# Patient Record
Sex: Female | Born: 1970 | Race: Black or African American | Hispanic: No | Marital: Single | Smoking: Never smoker
Health system: Southern US, Community
[De-identification: ages and names within clinical notes are randomized; demographics above are authoritative.]

## PROBLEM LIST (undated history)

## (undated) DIAGNOSIS — D259 Leiomyoma of uterus, unspecified: Secondary | ICD-10-CM

## (undated) DIAGNOSIS — N200 Calculus of kidney: Secondary | ICD-10-CM

## (undated) DIAGNOSIS — N803 Endometriosis of pelvic peritoneum: Secondary | ICD-10-CM

## (undated) DIAGNOSIS — Z973 Presence of spectacles and contact lenses: Secondary | ICD-10-CM

## (undated) DIAGNOSIS — D649 Anemia, unspecified: Secondary | ICD-10-CM

## (undated) DIAGNOSIS — N261 Atrophy of kidney (terminal): Secondary | ICD-10-CM

## (undated) DIAGNOSIS — D472 Monoclonal gammopathy: Principal | ICD-10-CM

## (undated) DIAGNOSIS — N939 Abnormal uterine and vaginal bleeding, unspecified: Secondary | ICD-10-CM

## (undated) DIAGNOSIS — N1330 Unspecified hydronephrosis: Secondary | ICD-10-CM

## (undated) HISTORY — DX: Endometriosis of pelvic peritoneum: N80.3

## (undated) HISTORY — DX: Monoclonal gammopathy: D47.2

## (undated) HISTORY — PX: OTHER SURGICAL HISTORY: SHX169

## (undated) HISTORY — DX: Leiomyoma of uterus, unspecified: D25.9

---

## 2000-05-13 ENCOUNTER — Emergency Department (HOSPITAL_COMMUNITY): Admission: EM | Admit: 2000-05-13 | Discharge: 2000-05-13 | Payer: Self-pay | Admitting: Emergency Medicine

## 2000-05-14 ENCOUNTER — Encounter: Payer: Self-pay | Admitting: Emergency Medicine

## 2000-12-05 ENCOUNTER — Other Ambulatory Visit: Admission: RE | Admit: 2000-12-05 | Discharge: 2000-12-05 | Payer: Self-pay | Admitting: Obstetrics & Gynecology

## 2001-03-27 ENCOUNTER — Ambulatory Visit (HOSPITAL_COMMUNITY): Admission: RE | Admit: 2001-03-27 | Discharge: 2001-03-27 | Payer: Self-pay | Admitting: Obstetrics & Gynecology

## 2001-03-27 ENCOUNTER — Encounter (INDEPENDENT_AMBULATORY_CARE_PROVIDER_SITE_OTHER): Payer: Self-pay | Admitting: *Deleted

## 2001-03-27 HISTORY — PX: OTHER SURGICAL HISTORY: SHX169

## 2001-09-22 ENCOUNTER — Other Ambulatory Visit: Admission: RE | Admit: 2001-09-22 | Discharge: 2001-09-22 | Payer: Self-pay | Admitting: Obstetrics & Gynecology

## 2002-04-05 ENCOUNTER — Inpatient Hospital Stay (HOSPITAL_COMMUNITY): Admission: AD | Admit: 2002-04-05 | Discharge: 2002-04-08 | Payer: Self-pay | Admitting: Obstetrics & Gynecology

## 2003-05-08 ENCOUNTER — Emergency Department (HOSPITAL_COMMUNITY): Admission: AD | Admit: 2003-05-08 | Discharge: 2003-05-08 | Payer: Self-pay | Admitting: Family Medicine

## 2003-05-10 ENCOUNTER — Emergency Department (HOSPITAL_COMMUNITY): Admission: AD | Admit: 2003-05-10 | Discharge: 2003-05-10 | Payer: Self-pay | Admitting: Family Medicine

## 2003-05-31 ENCOUNTER — Emergency Department (HOSPITAL_COMMUNITY): Admission: EM | Admit: 2003-05-31 | Discharge: 2003-05-31 | Payer: Self-pay | Admitting: Family Medicine

## 2003-07-04 ENCOUNTER — Emergency Department (HOSPITAL_COMMUNITY): Admission: EM | Admit: 2003-07-04 | Discharge: 2003-07-04 | Payer: Self-pay | Admitting: *Deleted

## 2005-05-21 ENCOUNTER — Other Ambulatory Visit: Admission: RE | Admit: 2005-05-21 | Discharge: 2005-05-21 | Payer: Self-pay | Admitting: Obstetrics and Gynecology

## 2006-09-28 ENCOUNTER — Emergency Department (HOSPITAL_COMMUNITY): Admission: EM | Admit: 2006-09-28 | Discharge: 2006-09-28 | Payer: Self-pay | Admitting: Emergency Medicine

## 2006-10-11 ENCOUNTER — Ambulatory Visit (HOSPITAL_BASED_OUTPATIENT_CLINIC_OR_DEPARTMENT_OTHER): Admission: RE | Admit: 2006-10-11 | Discharge: 2006-10-11 | Payer: Self-pay | Admitting: Urology

## 2006-10-18 ENCOUNTER — Ambulatory Visit (HOSPITAL_COMMUNITY): Admission: RE | Admit: 2006-10-18 | Discharge: 2006-10-18 | Payer: Self-pay | Admitting: Urology

## 2006-10-22 ENCOUNTER — Ambulatory Visit: Admission: RE | Admit: 2006-10-22 | Discharge: 2006-10-22 | Payer: Self-pay | Admitting: Gynecologic Oncology

## 2007-02-03 ENCOUNTER — Ambulatory Visit (HOSPITAL_COMMUNITY): Admission: RE | Admit: 2007-02-03 | Discharge: 2007-02-03 | Payer: Self-pay | Admitting: Urology

## 2007-04-21 ENCOUNTER — Encounter: Admission: RE | Admit: 2007-04-21 | Discharge: 2007-04-21 | Payer: Self-pay | Admitting: Obstetrics & Gynecology

## 2007-04-25 ENCOUNTER — Ambulatory Visit (HOSPITAL_BASED_OUTPATIENT_CLINIC_OR_DEPARTMENT_OTHER): Admission: RE | Admit: 2007-04-25 | Discharge: 2007-04-25 | Payer: Self-pay | Admitting: Urology

## 2007-10-24 ENCOUNTER — Ambulatory Visit (HOSPITAL_BASED_OUTPATIENT_CLINIC_OR_DEPARTMENT_OTHER): Admission: RE | Admit: 2007-10-24 | Discharge: 2007-10-24 | Payer: Self-pay | Admitting: Urology

## 2008-05-04 ENCOUNTER — Ambulatory Visit (HOSPITAL_COMMUNITY): Admission: RE | Admit: 2008-05-04 | Discharge: 2008-05-04 | Payer: Self-pay | Admitting: Urology

## 2008-06-21 ENCOUNTER — Emergency Department (HOSPITAL_COMMUNITY): Admission: EM | Admit: 2008-06-21 | Discharge: 2008-06-21 | Payer: Self-pay | Admitting: Family Medicine

## 2008-07-07 ENCOUNTER — Encounter: Admission: RE | Admit: 2008-07-07 | Discharge: 2008-07-07 | Payer: Self-pay | Admitting: Family Medicine

## 2009-01-31 ENCOUNTER — Encounter: Admission: RE | Admit: 2009-01-31 | Discharge: 2009-01-31 | Payer: Self-pay | Admitting: Family Medicine

## 2010-05-31 ENCOUNTER — Other Ambulatory Visit: Payer: Self-pay | Admitting: Family Medicine

## 2010-05-31 DIAGNOSIS — Z1231 Encounter for screening mammogram for malignant neoplasm of breast: Secondary | ICD-10-CM

## 2010-06-13 NOTE — Op Note (Signed)
NAMESAMONE, GUHL              ACCOUNT NO.:  [id number]   MEDICAL RECORD NO.:  [id number]          PATIENT TYPE:  AMB   LOCATION:  [facility]                         FACILITY:  [facility]   PHYSICIAN:  Lindaann Slough, M.D.  DATE OF BIRTH:  01-16-1971   DATE OF PROCEDURE:  10/24/2007  DATE OF DISCHARGE:                               OPERATIVE REPORT   PREOPERATIVE DIAGNOSES:  1. History of endometriosis.  2. Left ureteral obstruction.   POSTOPERATIVE DIAGNOSES:  1. History of endometriosis.  2. Left ureteral obstruction.   PROCEDURES:  1. Cystourethroscopy.  2. Removal of left ureteral stent.  3. Left retrograde pyelogram.  4. Intraoperative fluoroscopy.  5. Replacement of left 8 x 26 Polaris ureteral stent.   ATTENDING PHYSICIAN:  Su Grand, MD.   RESIDENT PHYSICIAN:  Dr. Delman Kitten.   ANESTHESIA:  General.   INDICATIONS FOR PROCEDURE:  Taylor Parks is a 40 year old African American  female with history of endometriosis.  She has been followed by Dr. Brunilda Payor  for quite some time for a left distal ureteral obstruction secondary to  her endometriosis.  She has been on chronic stent changes for this.   PROCEDURE IN DETAIL:  The patient was brought into the operating room,  placed in the supine position.  She was correctly identified by her  wristband.  Appropriate time-out was taken.  IV antibiotics were  administered.  General anesthesia was delivered.  Once adequately  anesthetized, she was placed in the dorsal lithotomy position.  Great  care taken to minimize the risk of peripheral neuropathy or compartment  syndrome.  Her perineum was prepped and draped sterilely.  We began by  placing a 22-French rigid cystoscopic sheath with a 12 degree lens and  normal saline as our irrigant into the bladder per urethra.  Urethra was  normal in course and caliber.  Upon entering the bladder clear urine was  identified, there were no urothelial abnormalities.  The distal end of  the Polaris stent  was visualized exiting from the left ureteral orifice,  it was grasped using flexible graspers and externalized out her urethral  meatus.  A sensor-tipped guidewire was advanced through the stent  directed to the left renal pelvis with the aid of fluoroscopy.  The  stent was then removed leaving the guidewire in place.  Cystoscope was  then replaced back into the bladder, with an acorn-tipped catheter we  performed left retrograde pyelogram.   The left retrograde pyelogram demonstrated complete  cessation/obstruction of flow in the intramural ureter/UVJ region.  There was no antegrade flow, there was no retrograde flow beyond this  despite having the guidewire in place.  The sensor-tipped catheter could  not be advanced past this obstruction either.  We subsequently removed  the acorn-tipped catheter and the cystoscope.  We then advanced a 6-  [place] open-ended catheter over the guidewire into the distal left  ureter just proximal to the area of obstruction.  Here a left retrograde  pyelogram demonstrated a normal ureter above the obstructed area and no  antegrade flow beyond it.  This did appear to be a short segment  of  obstructed ureter based on our imaging above and below it.  We then re-  advanced the sensor-tipped guidewire through the open-ended catheter,  again directed it into the left renal pelvis.  We backloaded the  cystoscope over the guidewire and successfully replaced, a left 8 x 28  Polaris stent with the proximal curl positioned where in the lower pole  calix, not completely coiled but coiled enough to maintain its position  and the distal bands were noted to be in the bladder.  The stent did end  at the level of the ureteral orifice.  It was effluxing clear urine and  contrast.  We emptied her bladder, removed the cystoscope and placed  some Urojet per urethra and this marked the end of our procedure.  She  tolerated the procedure well, there were no complications, she  awoke  from anesthesia, was taken to the recovery room in stable condition.  Dr. Brunilda Payor was present and participated in all aspects of the case.  There  were no complications.     ______________________________  Maia Plan, M.D.  Electronically Signed    DW/MEDQ  D:  10/24/2007  T:  10/25/2007  Job:  [id number]

## 2010-06-13 NOTE — Consult Note (Signed)
NAMETEONNA, COONAN              ACCOUNT NO.:  [id number]   MEDICAL RECORD NO.:  [id number]          PATIENT TYPE:  OUT   LOCATION:  GYN                          FACILITY:  [facility]   PHYSICIAN:  Paola A. Duard Brady, MD    DATE OF BIRTH:  09-15-1970   DATE OF CONSULTATION:  10/22/2006  DATE OF DISCHARGE:                                 CONSULTATION   HISTORY OF PRESENT ILLNESS:  The patient is a 40 year old gravida 4,  para 2 who appears to have had a history of pelvic pain for quite some  time for about the past 5-6 years.  She did undergo diagnostic  laparoscopy, biopsies of what was felt to be endometriosis, fulguration  of pelvic endometriosis, lysis of adhesions, hysteroscopy, D&C and  resection of a small endometrial polyp by Dr. Jennette Kettle in February 2007.  The patient states at that time that she did not know that she had  endometriosis.  We do have records of a Pap smear from that time that  was unremarkable.  She had acute worsening of her pain on August 30 and  presented to the emergency room at that time.  She underwent a CT scan  that showed left-sided hydronephrosis and findings consistent with  possible cervical malignancy.  Specifically, she was noted to have a  large pelvic mass that appeared to be infiltrating the left parametria  and sidewall.  The patient underwent a ureteral stent by Dr. Brunilda Payor.  Since that time, her abdominal and back pain has decreased somewhat.  She continues to have significant pelvic pain, not only with her menses  but throughout the month.  She had been seen by Dr. Normand Sloop and in May  2008 had a Jearld Adjutant IUD placed, felt to help with her menstrual period.  That did not help sufficiently.  Therefore, in July 2008, the IUD was  removed.  Her last cycle was October 14, 2006.  She states that the  only thing that really helps her pain is to take Percocet.  She really  has no benefit from ibuprofen.  In the past, she has taken birth control  pills since her  surgery with Dr. Jennette Kettle and she also states that birth  control pills did not help her endometriosis or pelvic pain  significantly.   PAST MEDICAL HISTORY:  As above.   PAST SURGICAL HISTORY:  1. D&C.  2. C section.  3. Fulguration.  4. Endometriosis.   SOCIAL HISTORY:  She is single.  She denies use of tobacco or alcohol.   FAMILY HISTORY:  Significant for heart disease in her mother.  Grandmother died of heart attack.  There is no history of any  gynecologic malignancies.  Past GYN history in 2002, significant pain,  nausea, vomiting with menses.  Was managed by birth control pills for  history of endometriosis.   REVIEW OF SYSTEMS:  Denies any chest pain, shortness of breath, nausea,  vomiting with the exception of with her menses.  She denies any fevers,  night sweats, any unintentional weight loss or weight gain.  She does  have general abdominal pain  which is worse with her menses.  She denies  any early satiety or bloating.  She has had a couple episodes of nausea  and vomiting with her menses.  She denies any dyspareunia.  She was seen  by Dr. Antionette Char in the earlier part of September.  Of note,  the patient had a negative Pap smear, negative GC and chlamydia in April  2007.  Pap smear was negative in September.  Endometrial biopsy was  performed in September which by report was also negative.   PHYSICAL EXAMINATION:  VITAL SIGNS:  Weight 142 pounds, height 5 feet  3/4 inches.  GENERAL:  Well-nourished, well-developed female in no acute distress.  NECK:  Supple with no lymphadenopathy, no thyromegaly.  LUNGS:  Clear to auscultation bilaterally.  CARDIOVASCULAR:  Regular rate and rhythm.  ABDOMEN:  Soft, nontender, nondistended.  She has a well-healed  transverse Pfannenstiel incision.  She has a well-healed infraumbilical  skin incision.  Abdomen is soft, nontender, nondistended.  No palpable  masses or past thyromegaly.  Groins are negative for adenopathy.   EXTREMITIES:  No edema.  PELVIC:  External genitalia is within normal limits.  Vagina is well  epithelialized.  The cervix is multiparous.  It is large and bulbus.  There are no visible lesions.  There is no menstrual flow.  There is no  discharge.  Bimanual examination reveals an abdominal pelvic mass  measuring approximately 8 cm.  It tends to be more towards the patient's  left side.  It is somewhat tender with palpation.  On rectovaginal  examination, the parametria on the left side appears to be somewhat  indurated.  The side wall is free.  The rectal mucosa is smooth.  The  parametria on the patient's right side is free as is the side wall.   ASSESSMENT:  A 40 year old, who in evaluating her history imaging  studies and physical examination has what is most consistent with severe  pelvic endometriosis.  She may very well have a large endometrioma  versus fibrotic endometriosis that has caused her retroperitoneal  fibrosis and kinking of her ureter.  I do not feel that she has an  obvious cervical carcinoma.  Her Pap smears have been normal.  She has  also had a negative endometrial biopsy.  In addition, there is no  appearance of anything that could be suggestive of ovarian cancer at  this point.  We had a lengthy discussion with Dr. Tamela Oddi present.  We will attempt Lupron.  The patient knows that the first cycle of  Lupron may not help her significantly, but however, we will proceed with  two cycles.  If her pain increases significantly, we may need to proceed  with exploratory laparotomy for both diagnosis and therapeutic purposes  immediately.  However if her for pain decreases significantly with  Lupron, we will follow her exam, and at some point if her exam becomes  improved, we can consider intervention with surgery.  If we proceed with  the Lupron and her symptoms improve but the exam findings do not  improve, will need to have of lengthy discussion regarding the  benefits  of surgery at that point.  A lengthy discussion was had.  This  discussion will continue with Dr. Tamela Oddi.      Paola A. Duard Brady, MD  Electronically Signed     PAG/MEDQ  D:  10/22/2006  T:  10/23/2006  Job:  [id number]   cc:   Roseanna Rainbow, M.D.  Fax: [phone]   W. Varney Baas, M.D.  Fax: [phone]   Telford Nab, R.N.  [address]  [place], [place] [address]

## 2010-06-13 NOTE — Op Note (Signed)
[name], Taylor Parks              ACCOUNT NO.:  [id number]   MEDICAL RECORD NO.:  [id number]          PATIENT TYPE:  AMB   LOCATION:  [facility]                         FACILITY:  [facility]   PHYSICIAN:  Lindaann Slough, M.D.  DATE OF BIRTH:  [birth year]   DATE OF PROCEDURE:  04/25/2007  DATE OF DISCHARGE:                               OPERATIVE REPORT   PREOPERATIVE DIAGNOSIS:  Left hydronephrosis secondary to endometriosis.   POSTOPERATIVE DIAGNOSIS:  Left hydronephrosis secondary to  endometriosis.   PROCEDURE:  Cystoscopy, left retrograde pyelogram and left double-J  stent exchange.   SURGEON:  Dr. Brunilda Payor.   ANESTHESIA:  General.   INDICATIONS:  The patient is a 40 year old female who has a left double-  J catheter in place for left hydronephrosis secondary to endometriosis.  She has been treated by Dr. Tamela Oddi.  Renal scan showed 21%  function of the left kidney, 79% function of the right.  She is  scheduled today for cystoscopy, left retrograde pyelogram and  replacement of double-J catheter.  The double-J catheter was first  inserted on October 11, 2006.   Under general anesthesia, the patient was prepped and draped and placed  in the dorsal lithotomy position. A panendoscope was inserted in the  bladder.  The bladder mucosa is normal.  There is no stone or tumor in  the bladder.  The distal curl of the double-J catheter is in the  bladder.  It was then grasped with grasping forceps and pulled out of  the urethra.  A Glidewire was then passed through the double-J catheter  and advanced all the way up into the renal pelvis and the double-J  catheter was removed.   RETROGRADE PYELOGRAM:   A cone-tip catheter was passed through the cystoscope into the left  ureteral orifice.  Contrast was then injected through the cone-tip  catheter.  The dye could not pass up into the mid ureter.  There is a  stricture at the distal ureter.  The cone-tip catheter was then removed.  An  open-ended catheter was passed over the Glidewire up to the mid  ureter.  Contrast was then injected through the open-ended catheter. The  proximal and mid ureter are moderately dilated. The calyces are  moderately dilated.  In pulling the ureteral catheter back into the  distal ureter, there was a point where the contrast would not go into  the distal ureter which is the point of obstruction.  The Glidewire was  then passed through the open-ended catheter and the open-ended catheter  was removed.   A #8-French - 26 Polaris stent was then passed over the guidewire. The  proximal curl of the Polaris stent is in the bladder and the loops of  the stent are in the bladder.  The bladder was then emptied and the  cystoscope and guidewire removed.   The patient tolerated the procedure well and left the OR in satisfactory  condition to post anesthesia care unit.      Lindaann Slough, M.D.  Electronically Signed     MN/MEDQ  D:  04/25/2007  T:  04/26/2007  Job:  [id number]   cc:   Roseanna Rainbow, M.D.  Fax: [phone]

## 2010-06-13 NOTE — Op Note (Signed)
Taylor Parks, Taylor Parks              ACCOUNT NO.:  [id number]   MEDICAL RECORD NO.:  [id number]          PATIENT TYPE:  AMB   LOCATION:  [facility]                         FACILITY:  [facility]   PHYSICIAN:  Lindaann Slough, M.D.  DATE OF BIRTH:  [birth year]   DATE OF PROCEDURE:  10/11/2006  DATE OF DISCHARGE:                               OPERATIVE REPORT   PREOPERATIVE DIAGNOSIS:  Left hydronephrosis.   POSTOPERATIVE DIAGNOSIS:  Left hydronephrosis.   PROCEDURE:  Cystoscopy, left retrograde pyelogram, ureteroscopy, and  insertion of double-J catheter.   SURGEON:  Danae Chen, M.D.   ANESTHESIA:  General.   INDICATION:  The patient is a 40 year old female who was seen in the  emergency room on September 28, 2006, with a history of sudden onset of  severe left flank pain associated with nausea and vomiting.  CT scan  showed marked left hydronephrosis down to the level of the distal  ureter.  The lower uterine segment was also enlarged.  The patient is  scheduled today for cystoscopy, retrograde pyelogram, and insertion of  double-J catheter.   DESCRIPTION OF PROCEDURE:  Under general anesthesia, the patient was  prepped and draped in the dorsal lithotomy position.  A panendoscope was  inserted in the bladder.  The bladder mucosa is normal.  There is no  stone or tumor in the bladder.  The ureteral orifices are in normal  position and shape with normal efflux from the right ureteral orifice  and decreased efflux from the left.   RETROGRADE PYELOGRAM:   A cone tip catheter was passed through the cystoscope into the left  ureteral orifice.  Contrast was then injected through the cone tip  catheter.  The distal ureter is normal.  There is an area of decreased  caliber of the ureter in the distal ureter below the SI joint with  marked dilation of the ureter above that area. The proximal ureter and  collecting system are also dilated with a kink at the upper ureter.  The  cone tip catheter was  then removed.   A sensor tip guidewire was then passed through the cystoscope into the  ureter and advanced all the way up into the renal pelvis.  A 5 French  open ended catheter was then passed over the guidewire and the guidewire  was removed.  Contrast was then injected through the open ended catheter  and the calices are markedly dilated.  The guidewire was then passed  through the open ended catheter and the open ended catheter was removed.  The cystoscope was removed.  A semi-rigid ureteroscope was then passed  in the bladder into the ureter. There is no evidence of filling defect  in the ureter and the strictured area was passed without great  difficulty into the upper ureter.  The upper ureter appears markedly  dilated without any evidence of filling defect.  The ureteroscope was  then removed.   The guidewire was then back loaded into the cystoscope and a 6 French 28  Contour double J catheter was passed over the guidewire.  The proximal  curl of the  double J catheter is in the renal pelvis, the distal curl is  in the bladder.  The bladder was then emptied and the cystoscope and  guidewire were removed.  Then, bimanual examination was  done. The uterus appeared markedly enlarged and is also firm and nodular  and that is more pronounced on the left side.  The patient tolerated the  procedure well and left the OR in satisfactory condition to the post  anesthesia care unit.      Lindaann Slough, M.D.  Electronically Signed     MN/MEDQ  D:  10/11/2006  T:  10/12/2006  Job:  [id number]   cc:   Naima A. Normand Sloop, M.D.  Fax: [phone]

## 2010-06-16 NOTE — Op Note (Signed)
[facility]  Patient:    Taylor Parks, Taylor Parks Visit Number: [id number] MRN: [id number]          Service Type: DSU Location: [facility] Attending Physician:  Minette Headland Dictated by:   Freddy Finner, M.D. Proc. Date: 03/27/01 Admit Date:  03/27/2001                             Operative Report  PREOPERATIVE DIAGNOSES:       Chronic left pelvic pain, nodular irregularity of endometrial cavity on pelvic ultrasound.  POSTOPERATIVE DIAGNOSES:      Uterine enlargement, extensive pelvic adhesions, omental adhesions to anterior abdominal wall, pelvic endometriosis, small endometrial polyp.  OPERATIVE PROCEDURE:          Laparoscopy, fulguration of pelvic endometriosis, lysis of pelvic adhesions, hysteroscopy with dilatation and curettage and resection of small endometrial polyp.  SURGEON:                      Freddy Finner, M.D.  ANESTHESIA:                   General.  ESTIMATED BLOOD LOSS:         Less than 20 cc.  FLUIDS:                       Sorbitol deficit of 120 cc.  COMPLICATIONS:                None.  INTRAOPERATIVE FINDINGS:      Essentially as noted in the postoperative diagnoses.  Those findings were recorded in still photographs which are retained in the office record.  PROCEDURE:                    Patient was admitted on the morning of surgery, brought to the operating room, placed under adequate general endotracheal anesthesia.  Placed in the dorsal lithotomy position using the [facility] stirrup system.  Betadine prep of abdomen, perineum, and vagina was accomplished in the usual fashion.  Bladder was evacuated with a Robinson catheter.  Cervix was visualized, grasped with a single tooth tenaculum, and a Hulka tenaculum inserted through the cervix without difficulty.  Sterile drapes were then applied.  Two small incisions were made, one at the umbilicus and one just above the symphysis.  Through the upper incision a 10 mm atraumatic  trocar was introduced without difficulty.  Direct inspection revealed adequate placement without evidence of injury on entry.  Pneumoperitoneum was allowed to accumulate.  A second 5 mm trocar was placed through the lower incision under direct visualization.  Through this trocar sleeve a blunt probe was used during the procedure as well as a [place] irrigating system.  Systematic examination of pelvic and abdominal contents was carried out.  The appendix was visualized and was normal.  The upper abdomen appeared to be normal including no perihepatic adhesions or lesions.  There was an omental adhesion to the anterior abdominal wall.  This is consistent with previous surgical delivery by cesarean.  There were extensive anterior adhesions or bandlike anterior adhesions between the bladder flap and the uterus.  There were scattered lesions along the bladder flap and to the left of midline which clearly represented active endometriosis with powder burn like lesions and evidence of old scarring.  There were adhesions of the rectosigmoid to the left pelvic side wall at and just  beneath the left uterosacral ligament.  The tubes and ovaries were actually free on both sides and appeared to be normal. The uterus was enlarged overall in size.  Using the reticulating endo shears through the operating channel of the laparoscope with unipolar cautery attached, the adhesions were lysed to the anterior abdominal wall.  One bleeding source on this piece of omentum was coagulated with bipolar coagulation for complete hemostasis.  The endometriotic lesions anterior to the uterus were fulgurated using the endo shears with unipolar cautery.  There was a small approximately 4 mm endometrioma on the left ovary which was fulgurated with the endo shears and drained.  The bowel was so tightly adherent to the left uterosacral region that it was elected not to proceed with aggressive dissection for fear of bowel  injury.  After completing the abdominal portion of the procedure copious irrigation was carried out.  The irrigating solution was aspirated from the abdomen with complete hemostasis. The gas was allowed to escape.  The instruments were removed.  The skin incisions were closed with interrupted subcuticular sutures of 3-0 Dexon and Steri-Strips were applied to the lower incision.  Approximately 6 cc of 0.25% Marcaine was used to inject the incision sites for postoperative analgesia. Attention was then turned to the hysteroscopic portion of the procedure.  The Hulka tenaculum was removed.  The single tooth tenaculum was used to grasp the anterior cervical lip.  Uterus sounded to 11 cm.  Using the [facility] 12.5 degree hysteroscope with 3% Sorbitol as the distending medium, careful systematic examination of the endometrial cavity was carried out.  There were some tiny polypoid structures noted along the uterine wall.  Both tubal ostia were identified.  One somewhat large, but still a small polyp was identified. Careful thorough sharp curettage and exploration with polyp forceps removed the polyps.  This was confirmed by reinspection of the uterine cavity.  At this point the instruments were removed.  The procedure was terminated.  The patient was awakened and taken to recovery in good condition.  She will be discharged to routine outpatient surgical instructions with a regular diet and with Vicodin for pain.  She is to return to the office in approximately two weeks for postoperative follow-up.  She is given routine outpatient surgical instructions. Dictated by:   Freddy Finner, M.D. Attending Physician:  Minette Headland DD:  03/27/01 TD:  03/27/01 Job: [id number]

## 2010-06-16 NOTE — H&P (Signed)
   NAMEGALYA, DUNNIGAN                          ACCOUNT NO.:  [id number]   MEDICAL RECORD NO.:  [id number]                   PATIENT TYPE:  MAT   LOCATION:  [facility]                                 FACILITY:  [facility]   PHYSICIAN:  Dineen Kid. Rana Snare, M.D.                 DATE OF BIRTH:  [birth year]   DATE OF ADMISSION:  04/05/2002  DATE OF DISCHARGE:                                HISTORY & PHYSICAL   HISTORY OF PRESENT ILLNESS:  The patient is a 40 year old G3, P1, A1  admitted at 39-1/2 weeks estimated gestational age for induction of labor  due to previous low segment transverse cesarean section and desire for  vaginal birth after cesarean per Dr. Jennette Kettle.  He plans on a two-stage  induction.   PAST MEDICAL HISTORY:  Negative.   PAST SURGICAL HISTORY:  Significant for low segment transverse cesarean  section in October 1994.  The cesarean section was for fetal distress after  placental abruption, and it was a low segment transverse cesarean section.   MEDICATIONS:  Prenatal vitamins.   ALLERGIES:  No known drug allergies.   PHYSICAL EXAMINATION:  VITAL SIGNS:  Blood pressure 102/80, weight 171.  ABDOMEN:  Gravid, nontender.  Fundal height is 39 cm.  Cervix is 1, long,  and high.   LABORATORY DATA:  Group B strep is negative.   IMPRESSION:  1. Intrauterine pregnancy at 39-1/[redacted] weeks gestational age.  2. Previous cesarean section with a planned vaginal birth after cesarean     attempt with documented low segment transverse cesarean section.  Plan a     two-stage induction.  Risks and benefits of vaginal birth after cesarean     were discussed with the patient including up to 3% risk of uterine     rupture.  The patient gives her informed consent.                                               Dineen Kid Rana Snare, M.D.    DCL/MEDQ  D:  04/03/2002  T:  04/03/2002  Job:  [id number]

## 2010-06-20 ENCOUNTER — Ambulatory Visit
Admission: RE | Admit: 2010-06-20 | Discharge: 2010-06-20 | Disposition: A | Discharge status: Home / Self Care | Payer: BC Managed Care – PPO | Source: Ambulatory Visit | Attending: Family Medicine | Admitting: Family Medicine

## 2010-06-20 DIAGNOSIS — Z1231 Encounter for screening mammogram for malignant neoplasm of breast: Secondary | ICD-10-CM

## 2010-10-23 LAB — POCT PREGNANCY, URINE: Operator id: 268271

## 2010-10-23 LAB — POCT HEMOGLOBIN-HEMACUE: Operator id: 268271

## 2010-10-30 LAB — POCT HEMOGLOBIN-HEMACUE: Hemoglobin: 11 — ABNORMAL LOW

## 2010-10-30 LAB — POCT PREGNANCY, URINE: Preg Test, Ur: NEGATIVE

## 2010-11-10 LAB — URINE CULTURE: Colony Count: >=100000

## 2010-11-10 LAB — URINALYSIS, ROUTINE W REFLEX MICROSCOPIC
Bilirubin Urine: NEGATIVE
Glucose, UA: NEGATIVE
Hgb urine dipstick: NEGATIVE
Ketones, ur: NEGATIVE
Nitrite: POSITIVE — AB
Protein, ur: NEGATIVE
Specific Gravity, Urine: 1.02
pH: 7
pH: 6

## 2010-11-10 LAB — URINE MICROSCOPIC-ADD ON

## 2010-11-10 LAB — COMPREHENSIVE METABOLIC PANEL
ALT: 10
Albumin: 3.4 — ABNORMAL LOW
Alkaline Phosphatase: 44
GFR calc Af Amer: >60
Potassium: 4.1
Sodium: 138
Total Protein: 7.4

## 2010-11-10 LAB — CBC
Platelets: 396
RDW: 14.1 — ABNORMAL HIGH

## 2011-01-30 HISTORY — PX: FOOT SURGERY: SHX648

## 2011-12-17 ENCOUNTER — Other Ambulatory Visit (HOSPITAL_COMMUNITY): Payer: Self-pay | Admitting: Urology

## 2011-12-17 DIAGNOSIS — N1330 Unspecified hydronephrosis: Secondary | ICD-10-CM

## 2011-12-25 ENCOUNTER — Other Ambulatory Visit (HOSPITAL_COMMUNITY): Payer: BC Managed Care – PPO

## 2012-01-08 ENCOUNTER — Encounter (HOSPITAL_COMMUNITY): Payer: BC Managed Care – PPO

## 2012-01-21 ENCOUNTER — Encounter (HOSPITAL_COMMUNITY)
Admission: RE | Admit: 2012-01-21 | Discharge: 2012-01-21 | Disposition: A | Discharge status: Home / Self Care | Payer: BC Managed Care – PPO | Source: Ambulatory Visit | Attending: Urology | Admitting: Urology

## 2012-01-21 DIAGNOSIS — N1330 Unspecified hydronephrosis: Secondary | ICD-10-CM

## 2012-01-21 MED ORDER — TECHNETIUM TC 99M MERTIATIDE
15.0000 | Freq: Once | INTRAVENOUS | Status: AC | PRN
  Administered 2012-01-21: 15 via INTRAVENOUS

## 2012-01-21 MED ORDER — FUROSEMIDE 10 MG/ML IJ SOLN
35.0000 mg | Freq: Once | INTRAMUSCULAR | Status: DC
  Filled 2012-01-21: qty 4

## 2012-01-29 HISTORY — PX: HAMMER TOE SURGERY: SHX385

## 2012-02-04 ENCOUNTER — Other Ambulatory Visit: Payer: Self-pay | Admitting: Obstetrics & Gynecology

## 2012-02-04 DIAGNOSIS — N809 Endometriosis, unspecified: Secondary | ICD-10-CM

## 2012-02-04 DIAGNOSIS — N9489 Other specified conditions associated with female genital organs and menstrual cycle: Secondary | ICD-10-CM

## 2012-02-04 DIAGNOSIS — D219 Benign neoplasm of connective and other soft tissue, unspecified: Secondary | ICD-10-CM

## 2012-02-11 ENCOUNTER — Other Ambulatory Visit (HOSPITAL_COMMUNITY): Payer: BC Managed Care – PPO

## 2012-02-12 ENCOUNTER — Ambulatory Visit (HOSPITAL_COMMUNITY)
Admission: RE | Admit: 2012-02-12 | Discharge: 2012-02-12 | Disposition: A | Discharge status: Home / Self Care | Payer: BC Managed Care – PPO | Source: Ambulatory Visit | Attending: Obstetrics & Gynecology | Admitting: Obstetrics & Gynecology

## 2012-02-12 DIAGNOSIS — D259 Leiomyoma of uterus, unspecified: Secondary | ICD-10-CM | POA: Insufficient documentation

## 2012-02-12 DIAGNOSIS — N809 Endometriosis, unspecified: Secondary | ICD-10-CM | POA: Insufficient documentation

## 2012-02-12 DIAGNOSIS — N9489 Other specified conditions associated with female genital organs and menstrual cycle: Secondary | ICD-10-CM | POA: Insufficient documentation

## 2012-02-12 DIAGNOSIS — D219 Benign neoplasm of connective and other soft tissue, unspecified: Secondary | ICD-10-CM

## 2012-04-28 ENCOUNTER — Telehealth: Payer: Self-pay | Admitting: *Deleted

## 2012-04-28 NOTE — Telephone Encounter (Signed)
Pt states she is on Aviane birth control taking two pills daily and is having trouble with heavy menstrual cycles. Pt states she is having her cycles regularly but she is bleeding so heavily at the time of her cycle that she is bleeding through her clothes. Pt states she has been on this birth control for about 2 months. Pt states in order for her insurance to cover the prescription she is having to take the placebo pills. Pt would like to know what she can do about the heavy menstrual cycles?

## 2012-04-30 NOTE — Telephone Encounter (Signed)
Offer appointment.

## 2012-05-01 NOTE — Telephone Encounter (Signed)
Pt scheduled an appointment for 05-05-12 @3pm 

## 2012-05-02 ENCOUNTER — Encounter: Payer: Self-pay | Admitting: *Deleted

## 2012-05-05 ENCOUNTER — Encounter: Payer: Self-pay | Admitting: Obstetrics & Gynecology

## 2012-05-05 ENCOUNTER — Ambulatory Visit (INDEPENDENT_AMBULATORY_CARE_PROVIDER_SITE_OTHER): Payer: BC Managed Care – PPO | Admitting: Obstetrics & Gynecology

## 2012-05-05 VITALS — BP 145/86 | HR 84 | Temp 98.3°F | Ht 68.75 in | Wt 173.0 lb

## 2012-05-05 DIAGNOSIS — N920 Excessive and frequent menstruation with regular cycle: Secondary | ICD-10-CM

## 2012-05-05 DIAGNOSIS — N809 Endometriosis, unspecified: Secondary | ICD-10-CM

## 2012-05-05 DIAGNOSIS — D259 Leiomyoma of uterus, unspecified: Secondary | ICD-10-CM

## 2012-05-05 LAB — CBC
HCT: 35.8 % — ABNORMAL LOW (ref 36.0–46.0)
Hemoglobin: 11.2 g/dL — ABNORMAL LOW (ref 12.0–15.0)
MCHC: 31.3 g/dL (ref 30.0–36.0)
RDW: 12.9 % (ref 11.5–15.5)
WBC: 6.7 10*3/uL (ref 4.0–10.5)

## 2012-05-05 LAB — POCT URINE PREGNANCY: Preg Test, Ur: NEGATIVE

## 2012-05-05 MED ORDER — NORETHINDRONE ACETATE 5 MG PO TABS: 5.0000 mg | ORAL_TABLET | Freq: Every day | ORAL | Status: DC

## 2012-05-05 NOTE — Progress Notes (Signed)
Subjective:     Taylor Parks is a 42 y.o. female here for a routine exam.  Current complaints: heavy bleeding with menses .   Gynecologic History Patient's last menstrual period was 04/24/2012. Contraception: abstinence and OCP (estrogen/progesterone) Last Pap: 01/2012. Results were: normal Last mammogram: [facility]. Results were: normal  Obstetric History OB History   Grav Para Term Preterm Abortions TAB SAB Ect Mult Living                   The following portions of the patient's history were reviewed and updated as appropriate: allergies, current medications, past family history, past medical history, past social history, past surgical history and problem list.  Review of Systems Pertinent items are noted in HPI.    Objective:     deferred    Assessment:   AUB-L.  Plan:   Medical management with a continuous progestin Return when necessary

## 2012-05-05 NOTE — Patient Instructions (Addendum)
: Patient drug information   Access [employer] Online here.  Copyright (608) [phone] [employer] All rights reserved.  (For additional information see "Norethindrone: Drug information" and see "Norethindrone: Pediatric drug information") Brand Names: [place]  Aygestin;  Iverson Alamin;  Errin;  [place]ather;  [place];  [place];  Lyza;  Nor-QD;  Nora-BE;  Ortho Micronor Brand Names: [place]  Micronor;  Norlutate What is this drug used for?  . ION:GEXB:M841:L2440102 It is used to prevent pregnancy.  . VOZ:DGUY:Q034:V4259563 It is used to treat uterine bleeding due to hormonal imbalance.  . OVF:IEPP:I951:O8416606 It is used to treat endometriosis.  . TKZ:SWFU:X323:F5732202 It is used to treat women who do not have a monthly period cycle. What do I need to tell my doctor BEFORE I take this drug?  . RKY:HCWC:B762:G3151761 If you have an allergy to norethindrone or any other part of this drug.  . YWV:PXTG:G269:S8546270 If you are allergic to any drugs like this one, any other drugs, foods, or other substances. Tell your doctor about the allergy and what signs you had, like rash; hives; itching; shortness of breath; wheezing; cough; swelling of face, lips, tongue, or throat; or any other signs.  . JJK:KXFG:H829:H3716967 If you are pregnant or may be pregnant. Do not take this drug if you are pregnant.  . ELF:YBOF:B510:C5852778 If you have had any of these health problems: Bleeding disorder; blood clots or risk of having a blood clot; breast cancer; cancer of the uterus, ovary, cervix, or vagina; liver disease; liver tumors; recent heart attack; recent stroke; or vaginal bleeding where the cause is not known.  EUM:PNTI:R443:X5400867 This is not a list of all drugs or health problems that interact with this drug.  YPP:JKDT:O671:I4580998 Tell your doctor and pharmacist about all of your drugs (prescription or OTC, natural products, vitamins) and health problems. You must check to make sure that it is safe for  you to take this drug with all of your drugs and health problems. Do not start, stop, or change the dose of any drug without checking with your doctor. What are some things I need to know or do while I take this drug?  . PJA:SNKN:L976:B3419379 Tell dentists, surgeons, and other doctors that you use this drug.  . KWI:OXBD:Z329:J2426834 Avoid driving and doing other tasks or actions that call for you to be alert until you see how this drug affects you.  . HDQ:QIWL:N989:Q1194174 This drug may raise your chance of having blood clots, a stroke, or a heart attack. Talk with your doctor.  . YCX:KGYJ:E563:J4970263 Avoid cigarette smoking. People smoking more than 15 cigarettes per day have more chance for heart disease.  . ZCH:YIFO:Y774:J2878676 If you have high blood sugar (diabetes), you will need to watch your blood sugar closely.  . HMC:NOBS:J628:Z6629476 Do monthly breast self-exams and have a gynecologic exam every year.  . LYY:TKPT:W656:C1275170 This drug may affect certain lab tests. Be sure your doctor and lab workers know you take this drug.  . YFV:CBSW:H675:F1638466 Tell your doctor if you are breast-feeding. You will need to talk about any risks to your baby.  . ZLD:JTTS:V779:T9030092 If you think there has been an overdose, call your poison control center or get medical care right away. Be ready to tell or show what was taken, how much, and when it happened.  ZRA:QTMA:U633:H5456256 Birth control:  . LSL:HTDS:K876:O1157262 Certain drugs, herbal products, or health problems could cause this drug to not work as well. Be sure your doctor knows about all of your drugs and health problems.  . MBT:DHRC:B638:G5364680  This drug does not stop the spread of diseases like HIV or hepatitis that are passed through blood or having sex. Do not have any kind of sex without using a condom. Do not share needles or other things like toothbrushes or razors. Talk with your doctor.  ZOX:WRUE:A540:J8119147 Hormone  replacement therapy (HRT):  . WGN:FAOZ:H086:V7846962 This drug is not approved for use in children. Talk with your doctor. What are some side effects that I need to call my doctor about right away?  XBM:WUXL:K440:N0272536 WARNING/CAUTION: Even though it may be rare, some people may have very bad and sometimes deadly side effects when taking a drug. Tell your doctor or get medical help right away if you have any of the following signs or symptoms that may be related to a very bad side effect:  . UYQ:IHKV:Q259:D6387564 Signs of an allergic reaction, like rash; hives; itching; red, swollen, blistered, or peeling skin with or without fever; wheezing; tightness in the chest or throat; trouble breathing or talking; unusual hoarseness; or swelling of the mouth, face, lips, tongue, or throat.  . PPI:RJJO:A416:S0630160 Signs of liver problems like dark urine, feeling tired, not hungry, upset stomach or stomach pain, light-colored stools, throwing up, or yellow skin or eyes.  . FUX:NATF:T732:K0254270 Change in strength on 1 side is greater than the other, trouble speaking or thinking, change in balance, or blurred eyesight.  . WCB:JSEG:B151:V6160737 Chest pain or pressure.  . TGG:YIRS:W546:E7035009 Shortness of breath.  . FGH:WEXH:B716:R6789381 Coughing up blood.  . OFB:PZWC:H852:D7824235 Swelling, warmth, numbness, change of color, or pain in a leg or arm.  . TIR:WERX:V400:Q6761950 Very bad headache.  . DTO:IZTI:W580:D9833825 Very upset stomach or throwing up.  . KNL:ZJQB:H419:F7902409 Very bad belly pain.  . BDZ:HGDJ:M426:S3419622 Very bad dizziness or passing out.  . WLN:LGXQ:J194:R7408144 Bulging eyes.  . YJE:HUDJ:S970:Y6378588 Change in eyesight.  . FOY:DXAJ:O878:M7672094 Loss of eyesight.  . BSJ:GGEZ:M629:U7654650 A lump in the breast, breast soreness, or nipple discharge.  . PTW:SFKC:L275:T7001749 Breast pain.  . SWH:QPRF:F638:G6659935 Vaginal itching or discharge.  . TSV:XBLT:J030:S9233007 Vaginal  bleeding that is not normal.  . MAU:QJFH:L456:Y5638937 Low mood (depression).  . DSK:AJGO:T157:W6203559 Mood changes.  . RCB:ULAG:T364:W8032122 Swelling in the feet or hands. What are some other side effects of this drug?  QMG:NOIB:B048:G8916945 All drugs may cause side effects. However, many people have no side effects or only have minor side effects. Call your doctor or get medical help if any of these side effects or any other side effects bother you or do not go away:  . WTU:UEKC:M034:J1791505 Headache.  . WPV:XYIA:X655:V7482707 Upset stomach or throwing up.  . EML:JQGB:E010:O7121975 Cramps.  . OIT:GPQD:I264:B5830940 Bloating.  . HWK:GSUP:J031:R9458592 Dizziness.  . TWK:MQKM:M381:R7116579 Breast soreness.  . UXY:BFXO:V291:B1660600 Not able to sleep.  . KHT:XHFS:F423:T5320233 Pimples (acne).  . IDH:WYSH:U837:G9021115 Weight gain.  . ZMC:EYEM:V361:Q2449753 This drug may cause dark patches of skin on your face. Avoid sun, sunlamps, and tanning beds. Use sunscreen and wear clothing and eyewear that protects you from the sun.  YYF:RTMY:T117:B5670141 Birth control:  . CVU:DTHY:H888:L5797282 Period (menstrual) changes. These include spotting or bleeding between cycles.  SUO:RVIF:B379:K3276147 Hormone replacement therapy (HRT):  . WLK:HVFM:B340:Z7096438 Vaginal bleeding or spotting.  VKF:MMCR:F543:K0677034 These are not all of the side effects that may occur. If you have questions about side effects, call your doctor. Call your doctor for medical advice about side effects.  KBT:CYEL:Y590:B3112162 You may report side effects to your national health agency. How is this drug best taken?  OEC:XFQH:K257:D0518335 Use this drug as ordered by your doctor. Read and follow  the dosing on the label closely.  . ZOX:WRUE:A540:J8119147 Follow how to use as you have been told by your doctor or read the package insert.  . WGN:FAOZ:H086:V7846962 Take tablet with or without food. Take with food if it causes an  upset stomach.  . XBM:WUXL:K440:N0272536 Take this drug at the same time of day.  UYQ:IHKV:Q259:D6387564 Birth control:  . PPI:RJJO:A416:S0630160 If you throw up or have diarrhea, this drug may not work as well. Use an extra form of birth control, like condoms, until you check with your doctor.  . FUX:NATF:T732:K0254270 Do not skip doses, even if you are spotting, bleeding, or feel sick to your stomach.  . WCB:JSEG:B151:V6160737 If you miss 2 periods in a row, take a pregnancy test before starting a new cycle. What do I do if I miss a dose?  TGG:YIRS:W546:E7035009 Birth control:  . FGH:WEXH:B716:R6789381 Take a missed dose as soon as you think about it and go back to your normal time.  . OFB:PZWC:H852:D7824235 Missed dosing facts may be found in the package insert or call your doctor to find out what to do.  TIR:WERX:V400:Q6761950 Hormone replacement therapy (HRT):  . DTO:IZTI:W580:D9833825 Take a missed dose as soon as you think about it.  . KNL:ZJQB:H419:F7902409 If it is close to the time for your next dose, skip the missed dose and go back to your normal time.  . BDZ:HGDJ:M426:S3419622 Do not take 2 doses at the same time or extra doses. How do I store and/or throw out this drug?  . WLN:LGXQ:J194:R7408144 Store at room temperature.  . YJE:HUDJ:S970:Y6378588 Store in a dry place. Do not store in a bathroom.  . FOY:DXAJ:O878:M7672094 Keep all drugs out of the reach of children and pets.  . BSJ:GGEZ:M629:U7654650 Check with your pharmacist about how to throw out unused drugs.  General drug facts  . PTW:SFKC:L275:T7001749 If your symptoms or health problems do not get better or if they become worse, call your doctor.  . SWH:QPRF:F638:G6659935 Do not share your drugs with others and do not take anyone else's drugs.  . TSV:XBLT:J030:S9233007 Keep a list of all your drugs (prescription, natural products, vitamins, OTC) with you. Give this list to your doctor.  . MAU:QJFH:L456:Y5638937 Talk with  the doctor before starting any new drug, including prescription or OTC, natural products, or vitamins.  . DSK:AJGO:T157:W6203559 Some drugs may have another patient information leaflet. If you have any questions about this drug, please talk with your doctor, pharmacist, or other health care provider.

## 2012-05-06 ENCOUNTER — Encounter: Payer: Self-pay | Admitting: Obstetrics & Gynecology

## 2012-05-06 DIAGNOSIS — D259 Leiomyoma of uterus, unspecified: Secondary | ICD-10-CM | POA: Insufficient documentation

## 2012-05-06 DIAGNOSIS — N920 Excessive and frequent menstruation with regular cycle: Secondary | ICD-10-CM | POA: Insufficient documentation

## 2012-05-06 DIAGNOSIS — N809 Endometriosis, unspecified: Secondary | ICD-10-CM | POA: Insufficient documentation

## 2012-05-06 LAB — RPR

## 2012-05-06 LAB — HIV ANTIBODY (ROUTINE TESTING W REFLEX): HIV: NONREACTIVE

## 2012-05-26 ENCOUNTER — Other Ambulatory Visit: Payer: Self-pay

## 2012-05-26 DIAGNOSIS — Z1231 Encounter for screening mammogram for malignant neoplasm of breast: Secondary | ICD-10-CM

## 2012-06-24 ENCOUNTER — Telehealth: Payer: Self-pay | Admitting: *Deleted

## 2012-06-24 NOTE — Telephone Encounter (Signed)
Patient called regarding her new prescription of Aygestin.  She says she started taking it last month and had noticed a big change in her eye sight.  Her last eye exam was three weeks ago.  She would like to know if she can have something different.

## 2012-06-24 NOTE — Telephone Encounter (Signed)
Offer Depot lupron or Mirena

## 2012-07-03 NOTE — Telephone Encounter (Signed)
Patient agrees with Mirena.  She still has 2 months worth of birth control pills and would like to call back to schedule appointment.  Advised her to make her appointment for when she is on the last week or two of her last pack.

## 2012-07-07 ENCOUNTER — Ambulatory Visit
Admission: RE | Admit: 2012-07-07 | Discharge: 2012-07-07 | Disposition: A | Discharge status: Home / Self Care | Payer: BC Managed Care – PPO | Source: Ambulatory Visit

## 2012-07-07 DIAGNOSIS — Z1231 Encounter for screening mammogram for malignant neoplasm of breast: Secondary | ICD-10-CM

## 2012-07-25 ENCOUNTER — Telehealth: Payer: Self-pay | Admitting: *Deleted

## 2012-07-25 NOTE — Telephone Encounter (Signed)
Patient called regarding scheduling an appointment for her Mirena.  Left message for patient to call back.

## 2012-07-30 ENCOUNTER — Encounter: Payer: Self-pay | Admitting: Obstetrics & Gynecology

## 2012-07-30 ENCOUNTER — Ambulatory Visit (INDEPENDENT_AMBULATORY_CARE_PROVIDER_SITE_OTHER): Payer: BC Managed Care – PPO | Admitting: Obstetrics & Gynecology

## 2012-07-30 VITALS — BP 137/95 | HR 65 | Temp 98.8°F

## 2012-07-30 DIAGNOSIS — N809 Endometriosis, unspecified: Secondary | ICD-10-CM

## 2012-07-30 DIAGNOSIS — Z30430 Encounter for insertion of intrauterine contraceptive device: Secondary | ICD-10-CM

## 2012-07-30 DIAGNOSIS — Z01812 Encounter for preprocedural laboratory examination: Secondary | ICD-10-CM

## 2012-07-30 DIAGNOSIS — Z3202 Encounter for pregnancy test, result negative: Secondary | ICD-10-CM

## 2012-07-30 DIAGNOSIS — N920 Excessive and frequent menstruation with regular cycle: Secondary | ICD-10-CM

## 2012-07-30 DIAGNOSIS — D259 Leiomyoma of uterus, unspecified: Secondary | ICD-10-CM

## 2012-07-30 HISTORY — PX: INTRAUTERINE DEVICE INSERTION: SHX323

## 2012-07-30 LAB — POCT URINE PREGNANCY: Preg Test, Ur: NEGATIVE

## 2012-07-30 MED ORDER — NORETHINDRONE 0.35 MG PO TABS: 1.0000 | ORAL_TABLET | Freq: Every day | ORAL | Status: DC

## 2012-07-30 NOTE — Patient Instructions (Signed)
Levonorgestrel intrauterine device (IUD) What is this medicine? LEVONORGESTREL IUD (LEE voe nor jes trel) is a contraceptive (birth control) device. The device is placed inside the uterus by a healthcare professional. It is used to prevent pregnancy and can also be used to treat heavy bleeding that occurs during your period. Depending on the device, it can be used for 3 to 5 years. This medicine may be used for other purposes; ask your health care provider or pharmacist if you have questions. What should I tell my health care provider before I take this medicine? They need to know if you have any of these conditions: -abnormal Pap smear -cancer of the breast, uterus, or cervix -diabetes -endometritis -genital or pelvic infection now or in the past -have more than one sexual partner or your partner has more than one partner -heart disease -history of an ectopic or tubal pregnancy -immune system problems -IUD in place -liver disease or tumor -problems with blood clots or take blood-thinners -use intravenous drugs -uterus of unusual shape -vaginal bleeding that has not been explained -an unusual or allergic reaction to levonorgestrel, other hormones, silicone, or polyethylene, medicines, foods, dyes, or preservatives -pregnant or trying to get pregnant -breast-feeding How should I use this medicine? This device is placed inside the uterus by a health care professional. Talk to your pediatrician regarding the use of this medicine in children. Special care may be needed. Overdosage: If you think you have taken too much of this medicine contact a poison control center or emergency room at once. NOTE: This medicine is only for you. Do not share this medicine with others. What if I miss a dose? This does not apply. What may interact with this medicine? Do not take this medicine with any of the following medications: -amprenavir -bosentan -fosamprenavir This medicine may also interact with  the following medications: -aprepitant -barbiturate medicines for inducing sleep or treating seizures -bexarotene -griseofulvin -medicines to treat seizures like carbamazepine, ethotoin, felbamate, oxcarbazepine, phenytoin, topiramate -modafinil -pioglitazone -rifabutin -rifampin -rifapentine -some medicines to treat HIV infection like atazanavir, indinavir, lopinavir, nelfinavir, tipranavir, ritonavir -St. John's wort -warfarin This list may not describe all possible interactions. Give your health care provider a list of all the medicines, herbs, non-prescription drugs, or dietary supplements you use. Also tell them if you smoke, drink alcohol, or use illegal drugs. Some items may interact with your medicine. What should I watch for while using this medicine? Visit your doctor or health care professional for regular check ups. See your doctor if you or your partner has sexual contact with others, becomes HIV positive, or gets a sexual transmitted disease. This product does not protect you against HIV infection (AIDS) or other sexually transmitted diseases. You can check the placement of the IUD yourself by reaching up to the top of your vagina with clean fingers to feel the threads. Do not pull on the threads. It is a good habit to check placement after each menstrual period. Call your doctor right away if you feel more of the IUD than just the threads or if you cannot feel the threads at all. The IUD may come out by itself. You may become pregnant if the device comes out. If you notice that the IUD has come out use a backup birth control method like condoms and call your health care provider. Using tampons will not change the position of the IUD and are okay to use during your period. What side effects may I notice from receiving this medicine?   Side effects that you should report to your doctor or health care professional as soon as possible: -allergic reactions like skin rash, itching or  hives, swelling of the face, lips, or tongue -fever, flu-like symptoms -genital sores -high blood pressure -no menstrual period for 6 weeks during use -pain, swelling, warmth in the leg -pelvic pain or tenderness -severe or sudden headache -signs of pregnancy -stomach cramping -sudden shortness of breath -trouble with balance, talking, or walking -unusual vaginal bleeding, discharge -yellowing of the eyes or skin Side effects that usually do not require medical attention (report to your doctor or health care professional if they continue or are bothersome): -acne -breast pain -change in sex drive or performance -changes in weight -cramping, dizziness, or faintness while the device is being inserted -headache -irregular menstrual bleeding within first 3 to 6 months of use -nausea This list may not describe all possible side effects. Call your doctor for medical advice about side effects. You may report side effects to FDA at 1-800-FDA-1088. Where should I keep my medicine? This does not apply. NOTE: This sheet is a summary. It may not cover all possible information. If you have questions about this medicine, talk to your doctor, pharmacist, or health care provider.  2013, [employer]. (02/15/2011 1:54:04 PM)  

## 2012-07-30 NOTE — Progress Notes (Signed)
IUD Insertion Procedure Note  Pre-operative Diagnosis: Endometriosis, AUB  Post-operative Diagnosis: same    Procedure Details  Urine pregnancy test was done and result was negative.  The risks (including infection, bleeding, pain, and uterine perforation) and benefits of the procedure were explained to the patient and Written informed consent was obtained.    Cervix cleansed with Betadine. Uterus sounded to 7 cm. IUD inserted without difficulty. String visible and trimmed. Patient tolerated procedure well.  IUD Information: Mirena.  Condition: Stable  Complications: None  Plan:  The patient was advised to call for any fever or for prolonged or severe pain or bleeding. She was advised to use OTC analgesics as needed for mild to moderate pain.   An informal [place] documented appropriate positioning of the the IUD

## 2012-07-31 ENCOUNTER — Ambulatory Visit: Payer: BC Managed Care – PPO | Admitting: Obstetrics & Gynecology

## 2012-08-14 ENCOUNTER — Ambulatory Visit: Payer: BC Managed Care – PPO | Admitting: Obstetrics & Gynecology

## 2012-09-03 ENCOUNTER — Ambulatory Visit: Payer: BC Managed Care – PPO | Admitting: Obstetrics & Gynecology

## 2012-11-03 ENCOUNTER — Encounter (HOSPITAL_COMMUNITY): Payer: Self-pay | Admitting: *Deleted

## 2012-11-03 ENCOUNTER — Emergency Department (HOSPITAL_COMMUNITY): Payer: BC Managed Care – PPO

## 2012-11-03 ENCOUNTER — Emergency Department (HOSPITAL_COMMUNITY)
Admission: EM | Admit: 2012-11-03 | Discharge: 2012-11-03 | Disposition: A | Discharge status: Home / Self Care | Payer: BC Managed Care – PPO | Attending: Emergency Medicine | Admitting: Emergency Medicine

## 2012-11-03 DIAGNOSIS — N289 Disorder of kidney and ureter, unspecified: Secondary | ICD-10-CM | POA: Insufficient documentation

## 2012-11-03 DIAGNOSIS — R350 Frequency of micturition: Secondary | ICD-10-CM | POA: Insufficient documentation

## 2012-11-03 DIAGNOSIS — Z79899 Other long term (current) drug therapy: Secondary | ICD-10-CM | POA: Insufficient documentation

## 2012-11-03 DIAGNOSIS — Z8742 Personal history of other diseases of the female genital tract: Secondary | ICD-10-CM | POA: Insufficient documentation

## 2012-11-03 DIAGNOSIS — N200 Calculus of kidney: Secondary | ICD-10-CM | POA: Insufficient documentation

## 2012-11-03 DIAGNOSIS — D259 Leiomyoma of uterus, unspecified: Secondary | ICD-10-CM | POA: Insufficient documentation

## 2012-11-03 LAB — URINALYSIS, ROUTINE W REFLEX MICROSCOPIC
Bilirubin Urine: NEGATIVE
Hgb urine dipstick: NEGATIVE
Nitrite: NEGATIVE
Protein, ur: NEGATIVE mg/dL
Specific Gravity, Urine: 1.007 (ref 1.005–1.030)
Urobilinogen, UA: 0.2 mg/dL (ref 0.0–1.0)

## 2012-11-03 LAB — BASIC METABOLIC PANEL
BUN: 15 mg/dL (ref 6–23)
Chloride: 98 mEq/L (ref 96–112)
GFR calc Af Amer: 72 mL/min — ABNORMAL LOW (ref >90)
GFR calc non Af Amer: 62 mL/min — ABNORMAL LOW (ref >90)
Potassium: 3.8 mEq/L (ref 3.5–5.1)
Sodium: 134 mEq/L — ABNORMAL LOW (ref 135–145)

## 2012-11-03 LAB — CBC WITH DIFFERENTIAL/PLATELET
Basophils Absolute: 0 10*3/uL (ref 0.0–0.1)
Basophils Relative: 1 % (ref 0–1)
Eosinophils Absolute: 0.2 10*3/uL (ref 0.0–0.7)
MCH: 26.4 pg (ref 26.0–34.0)
MCHC: 31.8 g/dL (ref 30.0–36.0)
Neutro Abs: 2 10*3/uL (ref 1.7–7.7)
Neutrophils Relative %: 48 % (ref 43–77)
Platelets: 185 10*3/uL (ref 150–400)
RDW: 11.9 % (ref 11.5–15.5)

## 2012-11-03 LAB — URINE MICROSCOPIC-ADD ON

## 2012-11-03 MED ORDER — SODIUM CHLORIDE 0.9 % IV SOLN
Freq: Once | INTRAVENOUS | Status: AC
  Administered 2012-11-03: 02:00:00 via INTRAVENOUS

## 2012-11-03 MED ORDER — ONDANSETRON HCL 4 MG/2ML IJ SOLN
4.0000 mg | Freq: Once | INTRAMUSCULAR | Status: AC
  Administered 2012-11-03: 4 mg via INTRAVENOUS
  Filled 2012-11-03: qty 2

## 2012-11-03 MED ORDER — IOHEXOL 300 MG/ML  SOLN
100.0000 mL | Freq: Once | INTRAMUSCULAR | Status: AC | PRN
  Administered 2012-11-03: 100 mL via INTRAVENOUS

## 2012-11-03 MED ORDER — MORPHINE SULFATE 4 MG/ML IJ SOLN
4.0000 mg | Freq: Once | INTRAMUSCULAR | Status: AC
  Administered 2012-11-03: 4 mg via INTRAVENOUS
  Filled 2012-11-03: qty 1

## 2012-11-03 MED ORDER — HYDROCODONE-ACETAMINOPHEN 5-325 MG PO TABS: 1.0000 | ORAL_TABLET | Freq: Four times a day (QID) | ORAL | Status: DC | PRN

## 2012-11-03 MED ORDER — IOHEXOL 300 MG/ML  SOLN
25.0000 mL | Freq: Once | INTRAMUSCULAR | Status: AC | PRN
  Administered 2012-11-03: 25 mL via ORAL

## 2012-11-03 NOTE — ED Provider Notes (Signed)
CSN: [id number]     Arrival date & time 11/03/12  0035 History   First MD Initiated Contact with Patient 11/03/12 0113     Chief Complaint  Patient presents with  . Flank Pain   (Consider location/radiation/quality/duration/timing/severity/associated sxs/prior Treatment) HPI Comments: Patient states that for the last week she's had intermittent left flank P. radiation and will last between 15 and hours she is not taking any medication for discomfort she reports frequency of urination but no dysuria no blood in her urine history of kidney stones she does state that she has seen Dr. Brunilda Payor and and was told that she had poor kidney function told the nurse 50% she told me 21% and that they were thinking of taking the kidney out when it stopped working  Patient is a 42 y.o. female presenting with flank pain. The history is provided by the patient.  Flank Pain This is a recurrent problem. The current episode started in the past 7 days. The problem occurs intermittently. The problem has been gradually worsening. Pertinent negatives include no anorexia, chest pain, chills, fever, myalgias, numbness or weakness. Nothing aggravates the symptoms. She has tried nothing for the symptoms. The treatment provided no relief.    Past Medical History  Diagnosis Date  . Uterine leiomyoma   . Endometriosis of pelvic peritoneum   . Renal disorder     pt states kidney not functioning correctly   Past Surgical History  Procedure Laterality Date  . Foot surgery Left 2013   Family History  Problem Relation Age of Onset  . Asthma    . Cataracts    . Diabetes    . Alzheimer's disease Mother    History  Substance Use Topics  . Smoking status: Never Smoker   . Smokeless tobacco: Not on file  . Alcohol Use: No   OB History   Grav Para Term Preterm Abortions TAB SAB Ect Mult Living                 Review of Systems  Constitutional: Negative for fever and chills.  Respiratory: Negative for shortness of  breath.   Cardiovascular: Negative for chest pain and leg swelling.  Gastrointestinal: Negative for anorexia.  Genitourinary: Positive for flank pain. Negative for dysuria, hematuria, decreased urine volume, vaginal bleeding, vaginal discharge and pelvic pain.  Musculoskeletal: Negative for myalgias.  Neurological: Negative for weakness and numbness.  All other systems reviewed and are negative.    Allergies  Review of patient's allergies indicates no known allergies.  Home Medications   Current Outpatient Rx  Name  Route  Sig  Dispense  Refill  . BIOTIN PO   Oral   Take 1 tablet by mouth 3 (three) times daily.         . ferrous fumarate (HEMOCYTE - 106 MG FE) 325 (106 FE) MG TABS   Oral   Take 1 tablet by mouth.         . levonorgestrel (MIRENA) 20 MCG/24HR IUD   Intrauterine   1 each by Intrauterine route continuous.          Marland Kitchen HYDROcodone-acetaminophen (NORCO/VICODIN) 5-325 MG per tablet   Oral   Take 1 tablet by mouth every 6 (six) hours as needed for pain.   6 tablet   0    BP 137/79  Pulse 62  Temp(Src) 98.3 F (36.8 C) (Oral)  Resp 20  Ht 5' 8.75" (1.746 m)  Wt 162 lb 8 oz (73.71 kg)  BMI 24.18  kg/m2  SpO2 100% Physical Exam  Nursing note and vitals reviewed. Constitutional: She is oriented to person, place, and time. She appears well-developed and well-nourished.  HENT:  Head: Normocephalic.  Eyes: Pupils are equal, round, and reactive to light.  Neck: Normal range of motion.  Cardiovascular: Normal rate and regular rhythm.   Pulmonary/Chest: Effort normal and breath sounds normal.  Abdominal: Soft. Bowel sounds are normal. She exhibits no distension. There is no hepatosplenomegaly. There is no CVA tenderness.  Musculoskeletal: Normal range of motion. She exhibits no edema and no tenderness.  Neurological: She is alert and oriented to person, place, and time.  Skin: Skin is warm. No rash noted.    ED Course  Procedures (including critical  care time) Labs Review Labs Reviewed  URINALYSIS, ROUTINE W REFLEX MICROSCOPIC - Abnormal; Notable for the following:    APPearance CLOUDY (*)    Leukocytes, UA TRACE (*)    All other components within normal limits  URINE MICROSCOPIC-ADD ON - Abnormal; Notable for the following:    Squamous Epithelial / LPF FEW (*)    Bacteria, UA FEW (*)    All other components within normal limits  CBC WITH DIFFERENTIAL - Abnormal; Notable for the following:    Hemoglobin 11.1 (*)    HCT 34.9 (*)    All other components within normal limits  BASIC METABOLIC PANEL - Abnormal; Notable for the following:    Sodium 134 (*)    Glucose, Bld 114 (*)    GFR calc non Af Amer 62 (*)    GFR calc Af Amer 72 (*)    All other components within normal limits  URINE CULTURE   Imaging Review Ct Abdomen Pelvis W Contrast  11/03/2012   *RADIOLOGY REPORT*  Clinical Data: Left flank pain.  CT ABDOMEN AND PELVIS WITH CONTRAST  Technique:  Multidetector CT imaging of the abdomen and pelvis was performed following the standard protocol during bolus administration of intravenous contrast.  Contrast: OMNIPAQUE IOHEXOL 300 MG/ML  SOLN  Comparison: CT of the abdomen and pelvis performed 10/18/2006, and pelvic ultrasound performed 02/12/2012  Findings: The visualized lung bases are clear.  A 6 mm hypodensity is noted within the right hepatic lobe; this is nonspecific but likely reflects a small cyst.  The liver is otherwise unremarkable in appearance.  The spleen is within normal limits.  The gallbladder is within normal limits.  There is slight apparent prominence of the pancreatic duct; the pancreas is otherwise unremarkable in appearance.  The adrenal glands are within normal limits.  There is chronic moderate left-sided hydronephrosis, with prominence of the left ureter along much of its course.  There is partial atrophy of the left kidney.  A 2 mm stone is noted at the lower pole of the left kidney.  The right kidney is  unremarkable in appearance.  No free fluid is identified.  The small bowel is unremarkable in appearance.  The stomach is within normal limits.  No acute vascular abnormalities are seen.  The appendix is normal in caliber and contains air, without evidence for appendicitis.  The colon is unremarkable in appearance.  The bladder is moderately distended and grossly unremarkable.  The uterus contains small apparent enhancing fibroids, and is otherwise unremarkable.  An intrauterine device is noted at the fundus of the uterus.  No suspicious adnexal masses are seen; the ovaries appear grossly unremarkable.  No inguinal lymphadenopathy is seen.  No acute osseous abnormalities are identified.  IMPRESSION:  1.  Chronic moderate left-sided hydronephrosis, worsened from 2008, with prominence of the left ureter along much of its course.  This may reflect underlying chronic obstruction or stricture. Associated partial atrophy of the left kidney. 2.  2 mm nonobstructing stone at the lower pole of the left kidney.  3.  Likely small hepatic cyst. 4.  Small uterine fibroids noted.   Original Report Authenticated By: Tonia Ghent, M.D.    MDM   1. Renal stone   2. Leiomyoma of uterus, unspecified     Ct scan reviewed small renal stone will dc home pain control and FU with Dr. Konrad Dolores, NP 11/03/12 0617  Arman Filter, NP 11/03/12 [phone]

## 2012-11-03 NOTE — ED Notes (Signed)
Pt states that her left kidney is not working well, about 50%. Pt states pain on her left side for 14 days. Pt states today pain is worse.

## 2012-11-03 NOTE — ED Provider Notes (Signed)
Medical screening examination/treatment/procedure(s) were performed by non-physician practitioner and as supervising physician I was immediately available for consultation/collaboration.   Aanya Haynes L Zandyr Barnhill, MD 11/03/12 0704 

## 2012-11-04 LAB — URINE CULTURE: Colony Count: NO GROWTH

## 2012-11-06 ENCOUNTER — Ambulatory Visit: Payer: Self-pay | Admitting: Obstetrics & Gynecology

## 2012-11-06 ENCOUNTER — Ambulatory Visit: Payer: BC Managed Care – PPO | Admitting: Obstetrics & Gynecology

## 2012-11-11 ENCOUNTER — Other Ambulatory Visit: Payer: Self-pay | Admitting: Urology

## 2012-11-13 ENCOUNTER — Encounter: Payer: Self-pay | Admitting: Obstetrics & Gynecology

## 2012-11-18 ENCOUNTER — Encounter (HOSPITAL_BASED_OUTPATIENT_CLINIC_OR_DEPARTMENT_OTHER): Payer: Self-pay | Admitting: *Deleted

## 2012-11-19 ENCOUNTER — Encounter (HOSPITAL_BASED_OUTPATIENT_CLINIC_OR_DEPARTMENT_OTHER): Payer: Self-pay | Admitting: *Deleted

## 2012-11-19 NOTE — Progress Notes (Addendum)
NPO AFTER MN. ARRIVE AT 1030. NEEDS URINE PREG. CURRENT CBC AND BMET IN EPIC AND CHART.  MAY TAKE TYLENOL # 3 IF NEEDED W/ SIP OF WATER.

## 2012-11-24 NOTE — H&P (Signed)
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  She was seen in November 2013 by Jetta Lout for left flank pain.  She has not had any pain since.  Renal scan showed 28.8% function left kidney, 71.2% right compared to 22.6 and 77.4 in 2010.  Renal function is essentially unchanged.  She has not had a stent since April 2010 and the renal function has been stable and has been on conservative observation.   Interval hx:  10 days ago Taylor Parks presented  to the ER due to localized dull left flank pain  10/10  in severity and was told that she had a stone and was given hydrocodone and strainer but still has not seen any stones.  Pain resolved  2 days later but returned yesterday w/ the severity of 7-8 /10 associated w/ vomitting and nausea.  Denies fever/chills/hematuria.    Took Tylenol twice in AM yesterday and Vicodin last night.  Last dose of pain med Vicodin was at 6am today.  She tries not to take Vicodin since she is unable to work due to its side effect of groginess.   Currently pain is 2- 3 /10.   CT done in the ER  indicated a 2mm left lower pole renal stone and worsening of hydronephrosis compared to previous CT in 2008.  Denies any other associated voiding symptoms or alleviating/aggravating factors.   Past Medical History Problems  1. History of  Hydronephrosis 591  Surgical History Problems  1. History of  Cesarean Section 2. History of  Cystoscopy With Insertion Of Ureteral Stent Left 3. History of  Cystoscopy With Insertion Of Ureteral Stent Left 4. History of  Dilation And Curettage  Current Meds 1. Mirena IUD; Therapy: (Recorded:13Oct2014) to  Family History Problems  1. Family history of  Diabetes Mellitus V18.0 2. Family history of  Family Health Status Number Of Children 2 daug  Social History Problems  1. Alcohol Use 2. Marital History - Single 3. Never A Smoker Denied  4. History of  Caffeine Use 5. History of  Tobacco  Use  Review of Systems Genitourinary, constitutional, skin, eye, otolaryngeal, hematologic/lymphatic, cardiovascular, pulmonary, endocrine, musculoskeletal, gastrointestinal, neurological and psychiatric system(s) were reviewed and pertinent findings if present are noted.  Genitourinary: no urinary frequency, no urinary urgency and no suprapubic pain.  Gastrointestinal: flank pain, but no abdominal pain.    Vitals Vital Signs [Data Includes: Last 1 Day]  13Oct2014 02:50PM  Blood Pressure: 116 / 81 Temperature: 98.7 F Heart Rate: 63  Physical Exam Constitutional: Well nourished and well developed . No acute distress.  ENT:. The ears and nose are normal in appearance.  Pulmonary: No respiratory distress.  Cardiovascular:. The arterial pulses are normal.  Abdomen: The abdomen is soft and nontender. No right CVA tenderness and left CVA tenderness.  Neuro/Psych:. Mood and affect are appropriate.    Results/Data  The following clinical lab reports were reviewed:  WBC 3-6, no bacteria. Selected Results  UA With REFLEX 13Oct2014 02:41PM Seward Grater   Test Name Result Flag Reference  COLOR YELLOW  YELLOW  APPEARANCE CLEAR  CLEAR  SPECIFIC GRAVITY 1.020  1.005-1.030  pH 6.0  5.0-8.0  GLUCOSE NEG mg/dL  NEG  BILIRUBIN NEG  NEG  KETONE TRACE mg/dL A NEG  BLOOD SMALL A NEG  PROTEIN NEG mg/dL  NEG  UROBILINOGEN 0.2 mg/dL  1.6-1.0  NITRITE NEG  NEG  LEUKOCYTE ESTERASE NEG  NEG  SQUAMOUS EPITHELIAL/HPF FEW  RARE  WBC 3-6 WBC/hpf A <3  RBC 0-2 RBC/hpf  <3  BACTERIA RARE  RARE  CRYSTALS NONE SEEN  NONE SEEN  CASTS NONE SEEN  NONE SEEN   Assessment Assessed  1. Hydronephrosis On The Left 591 2. Flank Pain Left   UA normal except for WBC 3-6, no bacteria or pyuria.  Taylor Parks is afebrile w/ vitals WNL.  CT pelvis w/ contrast on 11/03/12 IMPRESSION:  1. Chronic moderate left-sided hydronephrosis, worsened from 2008,  with prominence of the left ureter along much of its course.  This  may reflect underlying chronic obstruction or stricture.  Associated partial atrophy of the left kidney.  2. 2 mm nonobstructing stone at the lower pole of the left kidney.  3. Likely small hepatic cyst.  4. Small uterine fibroids noted   Plan Health Maintenance (V70.0)  1. UA With REFLEX  Done: 13Oct2014 02:41PM  Discussion/Summary   I explained to Taylor Parks that since CT indicated only one non-obstructng 2mm left lower pole of kidney, it is highly unlikely that her flank pain is caused by a renal stone. Informed Taylor Parks that per CT finding, the chronic left hydronephrosis has worsened, correlating with Taylor Parks acute onset and intermittent left side/flank pain for the past 2 weeks.  We also discussed treament options : left nephrectomy as permanent solution to resolve the left hydroneprhrosis (especially since she has a low function partially atrophic left kidney) vs. reinsertion of the left ureteral stent which will need to be exchanged every 6 months.  Taylor Parks was already previously  been informed of these treatment options by Dr. Brunilda Payor during recent office visit.  After some consideration, Taylor Parks would like to return for reinsertion of left ureteral stent.  Dr. Brunilda Payor winformed and his surgery scheduler will contact Taylor Parks to coordinate OR date and time.   - Tylenol # 3 Rx given to pt for pain relief PRN.  Hopefully this med does not cause as much grogginess as the Vicodin.

## 2012-11-25 ENCOUNTER — Ambulatory Visit (HOSPITAL_COMMUNITY): Payer: BC Managed Care – PPO

## 2012-11-25 ENCOUNTER — Encounter (HOSPITAL_BASED_OUTPATIENT_CLINIC_OR_DEPARTMENT_OTHER): Payer: BC Managed Care – PPO | Admitting: Anesthesiology

## 2012-11-25 ENCOUNTER — Encounter (HOSPITAL_BASED_OUTPATIENT_CLINIC_OR_DEPARTMENT_OTHER)
Admission: RE | Disposition: A | Discharge status: Home / Self Care | Payer: Self-pay | Source: Ambulatory Visit | Attending: Urology

## 2012-11-25 ENCOUNTER — Ambulatory Visit (HOSPITAL_BASED_OUTPATIENT_CLINIC_OR_DEPARTMENT_OTHER): Payer: BC Managed Care – PPO | Admitting: Anesthesiology

## 2012-11-25 ENCOUNTER — Ambulatory Visit (HOSPITAL_BASED_OUTPATIENT_CLINIC_OR_DEPARTMENT_OTHER)
Admission: RE | Admit: 2012-11-25 | Discharge: 2012-11-25 | Disposition: A | Discharge status: Home / Self Care | Payer: BC Managed Care – PPO | Source: Ambulatory Visit | Attending: Urology | Admitting: Urology

## 2012-11-25 ENCOUNTER — Encounter (HOSPITAL_BASED_OUTPATIENT_CLINIC_OR_DEPARTMENT_OTHER): Payer: Self-pay

## 2012-11-25 DIAGNOSIS — N1330 Unspecified hydronephrosis: Secondary | ICD-10-CM | POA: Insufficient documentation

## 2012-11-25 DIAGNOSIS — N200 Calculus of kidney: Secondary | ICD-10-CM | POA: Insufficient documentation

## 2012-11-25 DIAGNOSIS — N135 Crossing vessel and stricture of ureter without hydronephrosis: Secondary | ICD-10-CM | POA: Insufficient documentation

## 2012-11-25 DIAGNOSIS — N8000 Endometriosis of the uterus, unspecified: Secondary | ICD-10-CM | POA: Insufficient documentation

## 2012-11-25 DIAGNOSIS — Z975 Presence of (intrauterine) contraceptive device: Secondary | ICD-10-CM | POA: Insufficient documentation

## 2012-11-25 DIAGNOSIS — N269 Renal sclerosis, unspecified: Secondary | ICD-10-CM | POA: Insufficient documentation

## 2012-11-25 DIAGNOSIS — D259 Leiomyoma of uterus, unspecified: Secondary | ICD-10-CM | POA: Insufficient documentation

## 2012-11-25 HISTORY — PX: CYSTOSCOPY WITH STENT PLACEMENT: SHX5790

## 2012-11-25 HISTORY — DX: Presence of spectacles and contact lenses: Z97.3

## 2012-11-25 HISTORY — DX: Unspecified hydronephrosis: N13.30

## 2012-11-25 HISTORY — DX: Abnormal uterine and vaginal bleeding, unspecified: N93.9

## 2012-11-25 HISTORY — DX: Atrophy of kidney (terminal): N26.1

## 2012-11-25 HISTORY — PX: CYSTOSCOPY W/ RETROGRADES: SHX1426

## 2012-11-25 HISTORY — DX: Calculus of kidney: N20.0

## 2012-11-25 LAB — POCT PREGNANCY, URINE: Preg Test, Ur: NEGATIVE

## 2012-11-25 SURGERY — CYSTOSCOPY, WITH STENT INSERTION
Anesthesia: General | Site: Ureter | Laterality: Left | Wound class: Clean Contaminated

## 2012-11-25 MED ORDER — PROPOFOL 10 MG/ML IV BOLUS
INTRAVENOUS | Status: DC | PRN
  Administered 2012-11-25: 200 mg via INTRAVENOUS
  Administered 2012-11-25: 100 mg via INTRAVENOUS

## 2012-11-25 MED ORDER — LACTATED RINGERS IV SOLN
INTRAVENOUS | Status: DC
  Administered 2012-11-25: 12:00:00 via INTRAVENOUS
  Filled 2012-11-25: qty 1000

## 2012-11-25 MED ORDER — SODIUM CHLORIDE 0.9 % IR SOLN
Status: DC | PRN
  Administered 2012-11-25: 3000 mL via INTRAVESICAL

## 2012-11-25 MED ORDER — PROMETHAZINE HCL 25 MG/ML IJ SOLN
6.2500 mg | INTRAMUSCULAR | Status: DC | PRN
  Filled 2012-11-25: qty 1

## 2012-11-25 MED ORDER — MIDAZOLAM HCL 5 MG/5ML IJ SOLN
INTRAMUSCULAR | Status: DC | PRN
  Administered 2012-11-25: 2 mg via INTRAVENOUS

## 2012-11-25 MED ORDER — DEXAMETHASONE SODIUM PHOSPHATE 4 MG/ML IJ SOLN
INTRAMUSCULAR | Status: DC | PRN
  Administered 2012-11-25: 10 mg via INTRAVENOUS

## 2012-11-25 MED ORDER — CIPROFLOXACIN IN D5W 400 MG/200ML IV SOLN
400.0000 mg | INTRAVENOUS | Status: AC
  Administered 2012-11-25: 400 mg via INTRAVENOUS
  Filled 2012-11-25: qty 200

## 2012-11-25 MED ORDER — LIDOCAINE HCL (CARDIAC) 20 MG/ML IV SOLN
INTRAVENOUS | Status: DC | PRN
  Administered 2012-11-25: 80 mg via INTRAVENOUS

## 2012-11-25 MED ORDER — ONDANSETRON HCL 4 MG/2ML IJ SOLN
INTRAMUSCULAR | Status: DC | PRN
  Administered 2012-11-25: 4 mg via INTRAVENOUS

## 2012-11-25 MED ORDER — HYDROMORPHONE HCL PF 1 MG/ML IJ SOLN
0.2500 mg | INTRAMUSCULAR | Status: DC | PRN
  Filled 2012-11-25: qty 1

## 2012-11-25 MED ORDER — LACTATED RINGERS IV SOLN
INTRAVENOUS | Status: DC
  Administered 2012-11-25: 125 mL/h via INTRAVENOUS
  Filled 2012-11-25: qty 1000

## 2012-11-25 MED ORDER — FENTANYL CITRATE 0.05 MG/ML IJ SOLN
INTRAMUSCULAR | Status: DC | PRN
  Administered 2012-11-25: 50 ug via INTRAVENOUS
  Administered 2012-11-25 (×2): 25 ug via INTRAVENOUS

## 2012-11-25 SURGICAL SUPPLY — 17 items
BAG DRAIN URO-CYSTO SKYTR STRL (DRAIN) ×3 IMPLANT
CANISTER SUCT LVC 12 LTR MEDI- (MISCELLANEOUS) ×3 IMPLANT
CATH INTERMIT  6FR 70CM (CATHETERS) ×3 IMPLANT
CATH URET 5FR 28IN CONE TIP (BALLOONS) ×1
CATH URET 5FR 70CM CONE TIP (BALLOONS) ×2 IMPLANT
CLOTH BEACON ORANGE TIMEOUT ST (SAFETY) ×3 IMPLANT
DRAPE CAMERA CLOSED 9X96 (DRAPES) IMPLANT
GLOVE BIO SURGEON STRL SZ 6.5 (GLOVE) ×3 IMPLANT
GLOVE BIO SURGEON STRL SZ7 (GLOVE) ×3 IMPLANT
GLOVE INDICATOR 6.0 STRL GRN (GLOVE) ×3 IMPLANT
GOWN STRL NON-REIN LRG LVL3 (GOWN DISPOSABLE) ×6 IMPLANT
GUIDEWIRE 0.038 PTFE COATED (WIRE) ×3 IMPLANT
GUIDEWIRE ANG ZIPWIRE 038X150 (WIRE) IMPLANT
GUIDEWIRE STR DUAL SENSOR (WIRE) ×3 IMPLANT
NS IRRIG 500ML POUR BTL (IV SOLUTION) IMPLANT
PACK CYSTOSCOPY (CUSTOM PROCEDURE TRAY) ×3 IMPLANT
STENT URET 6FRX24 CONTOUR (STENTS) ×3 IMPLANT

## 2012-11-25 NOTE — Anesthesia Preprocedure Evaluation (Addendum)
Anesthesia Evaluation  Patient identified by MRN, date of birth, ID band Patient awake    Reviewed: Allergy & Precautions, H&P , NPO status , Patient's Chart, lab work & pertinent test results  Airway Mallampati: II TM Distance: >3 FB Neck ROM: Full    Dental  (+) Teeth Intact and Dental Advisory Given   Pulmonary neg pulmonary ROS,  breath sounds clear to auscultation  Pulmonary exam normal       Cardiovascular negative cardio ROS  Rhythm:Regular Rate:Normal     Neuro/Psych negative neurological ROS  negative psych ROS   GI/Hepatic negative GI ROS, Neg liver ROS,   Endo/Other  negative endocrine ROS  Renal/GU Renal disease  negative genitourinary   Musculoskeletal negative musculoskeletal ROS (+)   Abdominal   Peds  Hematology negative hematology ROS (+)   Anesthesia Other Findings   Reproductive/Obstetrics negative OB ROS                          Anesthesia Physical Anesthesia Plan  ASA: II  Anesthesia Plan: General   Post-op Pain Management:    Induction: Intravenous  Airway Management Planned: LMA  Additional Equipment:   Intra-op Plan:   Post-operative Plan: Extubation in OR  Informed Consent: I have reviewed the patients History and Physical, chart, labs and discussed the procedure including the risks, benefits and alternatives for the proposed anesthesia with the patient or authorized representative who has indicated his/her understanding and acceptance.   Dental advisory given  Plan Discussed with: CRNA  Anesthesia Plan Comments:         Anesthesia Quick Evaluation

## 2012-11-25 NOTE — Anesthesia Postprocedure Evaluation (Signed)
Anesthesia Post Note  Patient: Forensic scientist  Procedure(s) Performed: Procedure(s) (LRB): CYSTOSCOPY WITH STENT PLACEMENT (Left) CYSTOSCOPY WITH RETROGRADE PYELOGRAM  Anesthesia type: General  Patient location: PACU  Post pain: Pain level controlled  Post assessment: Post-op Vital signs reviewed  Last Vitals:  Filed Vitals:   11/25/12 1315  BP: 130/94  Pulse: 58  Temp:   Resp: 16    Post vital signs: Reviewed  Level of consciousness: sedated  Complications: No apparent anesthesia complications

## 2012-11-25 NOTE — Op Note (Signed)
Taylor Parks is a 42 y.o.   11/25/2012  General   Preop diagnosis: Left hydronephrosis.  Postop diagnosis: Same  Procedure done: Cystoscopy, left retrograde pyelogram, insertion of double-J stent  Surgeon: Wendie Simmer. Ala Capri  Anesthesia: General  Indication: Patient is a 42 years old female who is known to have a left hydronephrosis secondary to endometriosis. She was treated on team in April 2010 with double-J stent. She has been doing fairly well since however about 2 weeks ago she started having severe left flank pain. CT scan showed a 2 mm left lower pole renal calculus and worsening of hydronephrosis. She is scheduled today for retrograde pyelogram and double-J stent insertion. She will get a renal scan after double-J stent for differential function of the kidneys. If she has a minimal function of the left kidney we will discuss with her about nephrectomy  Procedure: Patient was identified by her wrist band and proper timeout was taken.  Under general anesthesia she was prepped and draped and placed in the dorsolithotomy position. A panendoscope was inserted in the bladder. The bladder mucosa is normal. There is no stone or tumor in the bladder. The ureteral orifices are in normal position and shape.  Left retrograde pyelogram:  A cone-tip catheter was passed through the cystoscope and the left ureteral orifice. 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 She is known to have endometriosis. The ureter proximal to the stricture is markedly dilated. The cone-tip catheter was removed. A sensor wire was passed through the cystoscope and the left ureter. A #6 [place] open-ended catheter was passed over the sensor wire up to the upper ureter. Contrast was injected through the open-ended catheter after removal of the sensor wire. The calyces are markedly dilated. The sensor wire was then passed  through the open-ended catheter and the open-ended catheter was removed.  And #6 [place] last 24 double-J stent was then passed over the sensor wire. When the tip of the wire was noted in the collecting system the sensor wire was removed. The proximal curl of the double-J stent is in the renal pelvis the distal curl of the stent is in the bladder.  The bladder was then emptied and the cystoscope removed.  Patient tolerated the procedure well and left the OR in satisfactory condition to postanesthesia care unit.  CC: Dr. Antionette Char

## 2012-11-25 NOTE — Anesthesia Procedure Notes (Signed)
Procedure Name: LMA Insertion Date/Time: 11/25/2012 12:35 PM Performed by: Renella Cunas D Pre-anesthesia Checklist: Patient identified, Emergency Drugs available, Suction available and Patient being monitored Patient Re-evaluated:Patient Re-evaluated prior to inductionOxygen Delivery Method: Circle System Utilized Preoxygenation: Pre-oxygenation with 100% oxygen Intubation Type: IV induction Ventilation: Mask ventilation without difficulty LMA: LMA inserted LMA Size: 4.0 Number of attempts: 1 Airway Equipment and Method: bite block Placement Confirmation: positive ETCO2 Tube secured with: Tape Dental Injury: Teeth and Oropharynx as per pre-operative assessment

## 2012-11-25 NOTE — Transfer of Care (Signed)
Immediate Anesthesia Transfer of Care Note  Patient: Taylor Parks  Procedure(s) Performed: Procedure(s) (LRB): CYSTOSCOPY WITH STENT PLACEMENT (Left) CYSTOSCOPY WITH RETROGRADE PYELOGRAM  Patient Location: PACU  Anesthesia Type: General  Level of Consciousness: awake, oriented, sedated and patient cooperative  Airway & Oxygen Therapy: Patient Spontanous Breathing and Patient connected to face mask oxygen  Post-op Assessment: Report given to PACU RN and Post -op Vital signs reviewed and stable  Post vital signs: Reviewed and stable  Complications: No apparent anesthesia complications

## 2012-11-26 ENCOUNTER — Encounter (HOSPITAL_BASED_OUTPATIENT_CLINIC_OR_DEPARTMENT_OTHER): Payer: Self-pay | Admitting: Urology

## 2012-11-26 LAB — URINE CULTURE: Colony Count: NO GROWTH

## 2012-12-02 ENCOUNTER — Other Ambulatory Visit (HOSPITAL_COMMUNITY): Payer: Self-pay | Admitting: Urology

## 2012-12-02 DIAGNOSIS — N1330 Unspecified hydronephrosis: Secondary | ICD-10-CM

## 2012-12-04 ENCOUNTER — Other Ambulatory Visit: Payer: Self-pay

## 2012-12-08 ENCOUNTER — Encounter (HOSPITAL_COMMUNITY)
Admission: RE | Admit: 2012-12-08 | Discharge: 2012-12-08 | Disposition: A | Discharge status: Home / Self Care | Payer: BC Managed Care – PPO | Source: Ambulatory Visit | Attending: Urology | Admitting: Urology

## 2012-12-08 DIAGNOSIS — N1330 Unspecified hydronephrosis: Secondary | ICD-10-CM | POA: Insufficient documentation

## 2012-12-08 MED ORDER — FUROSEMIDE 10 MG/ML IJ SOLN
40.0000 mg | Freq: Once | INTRAMUSCULAR | Status: AC
Start: 2012-12-08 — End: 2012-12-08
  Administered 2012-12-08: 35 mg via INTRAVENOUS
  Filled 2012-12-08: qty 4

## 2012-12-08 MED ORDER — TECHNETIUM TC 99M MERTIATIDE: 15.4000 | Freq: Once | INTRAVENOUS | Status: AC | PRN

## 2012-12-17 ENCOUNTER — Encounter: Payer: Self-pay | Admitting: Obstetrics & Gynecology

## 2013-04-09 ENCOUNTER — Other Ambulatory Visit: Payer: Self-pay | Admitting: Urology

## 2013-04-13 ENCOUNTER — Other Ambulatory Visit: Payer: Self-pay | Admitting: Urology

## 2013-05-21 ENCOUNTER — Encounter (HOSPITAL_COMMUNITY): Payer: Self-pay | Admitting: Pharmacy Technician

## 2013-05-28 ENCOUNTER — Encounter (HOSPITAL_COMMUNITY)
Admission: RE | Admit: 2013-05-28 | Discharge: 2013-05-28 | Disposition: A | Discharge status: Home / Self Care | Payer: BC Managed Care – PPO | Source: Ambulatory Visit | Attending: Urology | Admitting: Urology

## 2013-05-28 ENCOUNTER — Encounter (HOSPITAL_COMMUNITY): Payer: Self-pay

## 2013-05-28 DIAGNOSIS — Z01812 Encounter for preprocedural laboratory examination: Secondary | ICD-10-CM | POA: Insufficient documentation

## 2013-05-28 HISTORY — DX: Anemia, unspecified: D64.9

## 2013-05-28 LAB — HCG, SERUM, QUALITATIVE: PREG SERUM: NEGATIVE

## 2013-05-28 LAB — BASIC METABOLIC PANEL
BUN: 10 mg/dL (ref 6–23)
CO2: 27 meq/L (ref 19–32)
CREATININE: 0.98 mg/dL (ref 0.50–1.10)
Calcium: 9.9 mg/dL (ref 8.4–10.5)
Chloride: 101 mEq/L (ref 96–112)
GFR calc Af Amer: 80 mL/min — ABNORMAL LOW (ref >90)
GFR calc non Af Amer: 69 mL/min — ABNORMAL LOW (ref >90)
GLUCOSE: 94 mg/dL (ref 70–99)
Potassium: 4.3 mEq/L (ref 3.7–5.3)
SODIUM: 139 meq/L (ref 137–147)

## 2013-05-28 LAB — CBC
HCT: 38.1 % (ref 36.0–46.0)
Hemoglobin: 11.8 g/dL — ABNORMAL LOW (ref 12.0–15.0)
MCH: 25.8 pg — AB (ref 26.0–34.0)
MCHC: 31 g/dL (ref 30.0–36.0)
MCV: 83.4 fL (ref 78.0–100.0)
Platelets: 205 10*3/uL (ref 150–400)
RBC: 4.57 MIL/uL (ref 3.87–5.11)
RDW: 12.3 % (ref 11.5–15.5)
WBC: 4.7 10*3/uL (ref 4.0–10.5)

## 2013-05-28 LAB — ABO/RH: ABO/RH(D): A NEG

## 2013-05-28 NOTE — Patient Instructions (Addendum)
[place]  05/28/2013   Your procedure is scheduled on: Friday May 8th, 2015  Report to [place] at 700  AM.  Call this number if you have problems the morning of surgery [phone]   Remember:  Do not eat food  :After Midnight.Wednesday night 06-03-13, clear liquids all day Thursday 06-04-13 per dr Louis Meckel orders.no liquids after midnight     Take these medicines the morning of surgery with A SIP OF WATER: no meds to take                               You may not have any metal on your body including hair pins and piercings  Do not wear jewelry, make-up, lotions, powders, or deodorant.   Men may shave face and neck.  Do not bring valuables to the hospital. [place].  Contacts, dentures or bridgework may not be worn into surgery.  Leave suitcase in the car. After surgery it may be brought to your room.  For patients admitted to the hospital, checkout time is 11:00 AM the day of discharge.    ________________________________________________________________________  [facility] - Preparing for Surgery Before surgery, you can play an important role.  Because skin is not sterile, your skin needs to be as free of germs as possible.  You can reduce the number of germs on your skin by washing with CHG (chlorahexidine gluconate) soap before surgery.  CHG is an antiseptic cleaner which kills germs and bonds with the skin to continue killing germs even after washing. Please DO NOT use if you have an allergy to CHG or antibacterial soaps.  If your skin becomes reddened/irritated stop using the CHG and inform your nurse when you arrive at Short Stay. Do not shave (including legs and underarms) for at least 48 hours prior to the first CHG shower.  You may shave your face. Please follow these instructions carefully:  1.  Shower with CHG Soap the night before surgery and the  morning of Surgery.  2.  If you choose to wash your hair, wash  your hair first as usual with your  normal  shampoo.  3.  After you shampoo, rinse your hair and body thoroughly to remove the  shampoo.                           4.  Use CHG as you would any other liquid soap.  You can apply chg directly  to the skin and wash                       Gently with a scrungie or clean washcloth.  5.  Apply the CHG Soap to your body ONLY FROM THE NECK DOWN.   Do not use on open                           Wound or open sores. Avoid contact with eyes, ears mouth and genitals (private parts).                        Genitals (private parts) with your normal soap.             6.  Wash thoroughly, paying special attention to the area where your surgery  will  be performed.  7.  Thoroughly rinse your body with warm water from the neck down.  8.  DO NOT shower/wash with your normal soap after using and rinsing off  the CHG Soap.                9.  Pat yourself dry with a clean towel.            10.  Wear clean pajamas.            11.  Place clean sheets on your bed the night of your first shower and do not  sleep with pets. Day of Surgery : Do not apply any lotions/deodorants the morning of surgery.  Please wear clean clothes to the hospital/surgery center.  FAILURE TO FOLLOW THESE INSTRUCTIONS MAY RESULT IN THE CANCELLATION OF YOUR SURGERY PATIENT SIGNATURE_________________________________  NURSE SIGNATURE__________________________________  ________________________________________________________________________    CLEAR LIQUID DIET   Foods Allowed                                                                     Foods Excluded  Coffee and tea, regular and decaf                             liquids that you cannot  Plain Jell-O in any flavor                                             see through such as: Fruit ices (not with fruit pulp)                                     milk, soups, orange juice  Iced Popsicles                                    All solid  food Carbonated beverages, regular and diet                                    Cranberry, grape and apple juices Sports drinks like Gatorade Lightly seasoned clear broth or consume(fat free) Sugar, honey syrup  Sample Menu Breakfast                                Lunch                                     Supper Cranberry juice                    Beef broth                            Chicken broth Jell-O  Grape juice                           Apple juice Coffee or tea                        Jell-O                                      Popsicle                                                Coffee or tea                        Coffee or tea  _____________________________________________________________________   WHAT IS A BLOOD TRANSFUSION? Blood Transfusion Information  A transfusion is the replacement of blood or some of its parts. Blood is made up of multiple cells which provide different functions.  Red blood cells carry oxygen and are used for blood loss replacement.  White blood cells fight against infection.  Platelets control bleeding.  Plasma helps clot blood.  Other blood products are available for specialized needs, such as hemophilia or other clotting disorders. BEFORE THE TRANSFUSION  Who gives blood for transfusions?   Healthy volunteers who are fully evaluated to make sure their blood is safe. This is blood bank blood. Transfusion therapy is the safest it has ever been in the practice of medicine. Before blood is taken from a donor, a complete history is taken to make sure that person has no history of diseases nor engages in risky social behavior (examples are intravenous drug use or sexual activity with multiple partners). The donor's travel history is screened to minimize risk of transmitting infections, such as malaria. The donated blood is tested for signs of infectious diseases, such as HIV and hepatitis. The blood is then tested  to be sure it is compatible with you in order to minimize the chance of a transfusion reaction. If you or a relative donates blood, this is often done in anticipation of surgery and is not appropriate for emergency situations. It takes many days to process the donated blood. RISKS AND COMPLICATIONS Although transfusion therapy is very safe and saves many lives, the main dangers of transfusion include:   Getting an infectious disease.  Developing a transfusion reaction. This is an allergic reaction to something in the blood you were given. Every precaution is taken to prevent this. The decision to have a blood transfusion has been considered carefully by your caregiver before blood is given. Blood is not given unless the benefits outweigh the risks. AFTER THE TRANSFUSION  Right after receiving a blood transfusion, you will usually feel much better and more energetic. This is especially true if your red blood cells have gotten low (anemic). The transfusion raises the level of the red blood cells which carry oxygen, and this usually causes an energy increase.  The nurse administering the transfusion will monitor you carefully for complications. HOME CARE INSTRUCTIONS  No special instructions are needed after a transfusion. You may find your energy is better. Speak with your caregiver about any limitations on activity for underlying diseases you may have. SEEK MEDICAL CARE IF:  Your condition is not improving after your transfusion.  You develop redness or irritation at the intravenous (IV) site. SEEK IMMEDIATE MEDICAL CARE IF:  Any of the following symptoms occur over the next 12 hours:  Shaking chills.  You have a temperature by mouth above 102 F (38.9 C), not controlled by medicine.  Chest, back, or muscle pain.  People around you feel you are not acting correctly or are confused.  Shortness of breath or difficulty breathing.  Dizziness and fainting.  You get a rash or develop  hives.  You have a decrease in urine output.  Your urine turns a dark color or changes to pink, red, or brown. Any of the following symptoms occur over the next 10 days:  You have a temperature by mouth above 102 F (38.9 C), not controlled by medicine.  Shortness of breath.  Weakness after normal activity.  The white part of the eye turns yellow (jaundice).  You have a decrease in the amount of urine or are urinating less often.  Your urine turns a dark color or changes to pink, red, or brown. Document Released: 01/13/2000 Document Revised: 04/09/2011 Document Reviewed: 09/01/2007 [facility] Patient Information 2014 [place], [place].  _______________________________________________________________________

## 2013-05-29 LAB — URINE CULTURE
Colony Count: NO GROWTH
Culture: NO GROWTH

## 2013-06-04 NOTE — Op Note (Deleted)
See [facility] OP note scanned into chart. 

## 2013-06-04 NOTE — H&P (Signed)
Reason For Visit left flank pain   History of Present Illness This is a Taylor Parks who is a patient of Dr. Janice Norrie with a history of left flank pain. She was found to have an obstructed kidney distally likely secondary to endometriosis in 2009. She was been treated with stent exchanges which relieved her pain but she didn't tolerate the stents well. She most recently had a stent placed in Nov 2014 She had a renal scan in Nov 2014 showing 21% function of her left kidney. She presents today to discuss nephrectomy because she is tired of all the pain associated with her kidney. The patient has tolerated the stent reasonably well. She intermittently requires pain medication, she has been taking tramadol for this. She denies any fevers or chills. She denies any dysuria. She intermittently has blood-tinged urine. The patient also states that she has had more frequent bowel movements, denies any diarrhea or constipation.   Past Medical History Problems  1. History of Hydronephrosis (591)  Surgical History Problems  1. History of Cesarean Section 2. History of Cystoscopy With Insertion Of Ureteral Stent Left 3. History of Cystoscopy With Insertion Of Ureteral Stent Left 4. History of Cystoscopy With Insertion Of Ureteral Stent Left 5. History of Dilation And Curettage  Current Meds 1. Acetaminophen-Codeine 300-30 MG Oral Tablet; TAKE 1 TABLET EVERY 3 TO 4 HOURS  AS NEEDED FOR PAIN;  Therapy: 13Oct2014 to (Evaluate:21Oct2014); Last Rx:13Oct2014 Ordered 2. Docusate Sodium 100 MG Oral Tablet; Take 1 tablet twice daily;  Therapy: 13Oct2014 to (Last Rx:13Oct2014)  Requested for: 13Oct2014 Ordered 3. Mirena IUD;  Therapy: (Recorded:13Oct2014) to Recorded  Allergies Medication  1. Aleve TABS  Family History Problems  1. Family history of Diabetes Mellitus (V18.0) 2. Family history of Family Health Status Number Of Children   2 daug 3. No pertinent family history : Sibling  Social History Problems  1.  Alcohol Use 2. Denied: History of Caffeine Use 3. Marital History - Single 4. Never A Smoker 5. Denied: History of Tobacco Use  Vitals Vital Signs [Data Includes: Last 1 Day]  Recorded: [id number] 11:27AM  Temperature: 98.7 F Recorded: [id number] 11:25AM  Height: 5 ft 9 in Weight: 157 lb  BMI Calculated: 23.18 BSA Calculated: 1.86 Blood Pressure: 127 / 88 Heart Rate: 60  Physical Exam Constitutional: Well nourished and well developed . No acute distress.  ENT:. The ears and nose are normal in appearance.  Neck: The appearance of the neck is normal and no neck mass is present.  Pulmonary: No respiratory distress, normal respiratory rhythm and effort and clear bilateral breath sounds.  Cardiovascular: Heart rate and rhythm are normal . The arterial pulses are normal. No peripheral edema.  Abdomen: The abdomen is soft and nontender. No masses are palpated. No CVA tenderness. No hernias are palpable. No hepatosplenomegaly noted.  Lymphatics: The femoral and inguinal nodes are not enlarged or tender.  Skin: Normal skin turgor, no visible rash and no visible skin lesions.  Neuro/Psych:. Mood and affect are appropriate.    Results/Data Urine [Data Includes: Last 1 Day]   [id number]  COLOR YELLOW   APPEARANCE CLEAR   SPECIFIC GRAVITY 1.025   pH 6.5   GLUCOSE NEG mg/dL  BILIRUBIN NEG   KETONE NEG mg/dL  BLOOD LARGE   PROTEIN TRACE mg/dL  UROBILINOGEN 0.2 mg/dL  NITRITE NEG   LEUKOCYTE ESTERASE SMALL   SQUAMOUS EPITHELIAL/HPF RARE   WBC 0-2 WBC/hpf  RBC TNTC RBC/hpf  BACTERIA FEW   CRYSTALS NONE SEEN  CASTS NONE SEEN    The following images/tracing/specimen were independently visualized: .  CT ab/pelv 10/14: IMPRESSION: 1. Chronic moderate left-sided hydronephrosis, worsened from 2008, with prominence of the left ureter along much of its course. This may reflect underlying chronic obstruction or stricture. Associated partial atrophy of the left kidney. 2. 2 mm  nonobstructing stone at the lower pole of the left kidney. 3. Likely small hepatic cyst 4. Small uterine fibroids noted.  MAG3 renogram 11/14: IMPRESSION: 1. Normal functioning right kidney. 2. Delayed and diminished perfusion, cortical uptake, excretion and clearance by the left kidney. Findings may reflect diminished intrinsic left renal function and or chronic partial obstruction. 3. Split renal function is equal to 21.1% from the left kidney and 78.9% by the right kidney. No evidence for high-grade obstructive uropathy identified.    Assessment Left poorly functioning kidney, this is secondary to chronic ureteral obstruction was likely from endometriosis.   Plan Flank Pain Left  1. Start: Acetaminophen-Codeine 300-30 MG Oral Tablet (Tylenol with Codeine #3); TAKE 1  TO 2 TABLETS EVERY 4 TO 6 HOURS AS NEEDED FOR PAIN 2. Follow-up Schedule Surgery Office  Follow-up  Status: Complete  Done: [id number] Health Maintenance  3. UA With REFLEX; [Do Not Release]; Status:Complete;   Done: [id number] 11:20AM  Discussion/Summary I discussed the management options for the patient in detail for ureteral obstruction and poorly functioning kidney. We discussed continued periodic stent changes, an ileal ureter interposition graft, and a simple nephrectomy. Given the poorly functioning kidney, and the patient's frustration with the situation or unwillingness to continue with stent exchanges, I recommended a simple left laparoscopic nephrectomy. I went over the operation in detail with the patient including port placement, duration of the operation, risks and benefits and postoperative recovery. Having heard and understood the risk of the operation the patient is eager to proceed. We will get this scheduled at the patient's convenience.

## 2013-06-05 ENCOUNTER — Encounter (HOSPITAL_COMMUNITY)
Admission: RE | Disposition: A | Discharge status: Home / Self Care | Payer: Self-pay | Source: Ambulatory Visit | Attending: Urology

## 2013-06-05 ENCOUNTER — Encounter (HOSPITAL_COMMUNITY): Payer: BC Managed Care – PPO | Admitting: Certified Registered Nurse Anesthetist

## 2013-06-05 ENCOUNTER — Inpatient Hospital Stay (HOSPITAL_COMMUNITY): Payer: BC Managed Care – PPO | Admitting: Certified Registered Nurse Anesthetist

## 2013-06-05 ENCOUNTER — Encounter (HOSPITAL_COMMUNITY): Payer: Self-pay | Admitting: *Deleted

## 2013-06-05 ENCOUNTER — Inpatient Hospital Stay (HOSPITAL_COMMUNITY)
Admission: RE | Admit: 2013-06-05 | Discharge: 2013-06-07 | DRG: 660 | Disposition: A | Discharge status: Home / Self Care | Payer: BC Managed Care – PPO | Source: Ambulatory Visit | Attending: Urology | Admitting: Urology

## 2013-06-05 DIAGNOSIS — N261 Atrophy of kidney (terminal): Secondary | ICD-10-CM | POA: Diagnosis present

## 2013-06-05 DIAGNOSIS — Z833 Family history of diabetes mellitus: Secondary | ICD-10-CM

## 2013-06-05 DIAGNOSIS — N809 Endometriosis, unspecified: Secondary | ICD-10-CM | POA: Diagnosis present

## 2013-06-05 DIAGNOSIS — Z79899 Other long term (current) drug therapy: Secondary | ICD-10-CM

## 2013-06-05 DIAGNOSIS — N135 Crossing vessel and stricture of ureter without hydronephrosis: Secondary | ICD-10-CM | POA: Diagnosis present

## 2013-06-05 DIAGNOSIS — N269 Renal sclerosis, unspecified: Principal | ICD-10-CM | POA: Diagnosis present

## 2013-06-05 HISTORY — PX: LAPAROSCOPIC NEPHRECTOMY: SHX1930

## 2013-06-05 LAB — CBC
HEMATOCRIT: 35.1 % — AB (ref 36.0–46.0)
Hemoglobin: 11.1 g/dL — ABNORMAL LOW (ref 12.0–15.0)
MCH: 26.1 pg (ref 26.0–34.0)
MCHC: 31.6 g/dL (ref 30.0–36.0)
MCV: 82.4 fL (ref 78.0–100.0)
PLATELETS: 195 10*3/uL (ref 150–400)
RBC: 4.26 MIL/uL (ref 3.87–5.11)
RDW: 12.1 % (ref 11.5–15.5)
WBC: 9.5 10*3/uL (ref 4.0–10.5)

## 2013-06-05 LAB — BASIC METABOLIC PANEL
BUN: 13 mg/dL (ref 6–23)
CHLORIDE: 96 meq/L (ref 96–112)
CO2: 24 mEq/L (ref 19–32)
Calcium: 9.2 mg/dL (ref 8.4–10.5)
Creatinine, Ser: 1.1 mg/dL (ref 0.50–1.10)
GFR calc Af Amer: 70 mL/min — ABNORMAL LOW (ref >90)
GFR calc non Af Amer: 60 mL/min — ABNORMAL LOW (ref >90)
Glucose, Bld: 147 mg/dL — ABNORMAL HIGH (ref 70–99)
Potassium: 4 mEq/L (ref 3.7–5.3)
SODIUM: 135 meq/L — AB (ref 137–147)

## 2013-06-05 LAB — TYPE AND SCREEN
ABO/RH(D): A NEG
Antibody Screen: NEGATIVE

## 2013-06-05 SURGERY — NEPHRECTOMY, RADICAL, LAPAROSCOPIC, ADULT
Anesthesia: General | Site: Abdomen | Laterality: Left

## 2013-06-05 MED ORDER — SUCCINYLCHOLINE CHLORIDE 20 MG/ML IJ SOLN
INTRAMUSCULAR | Status: DC | PRN
  Administered 2013-06-05: 100 mg via INTRAVENOUS

## 2013-06-05 MED ORDER — BUPIVACAINE-EPINEPHRINE (PF) 0.25% -1:200000 IJ SOLN
INTRAMUSCULAR | Status: AC
  Filled 2013-06-05: qty 30

## 2013-06-05 MED ORDER — LIDOCAINE HCL (CARDIAC) 20 MG/ML IV SOLN
INTRAVENOUS | Status: AC
  Filled 2013-06-05: qty 5

## 2013-06-05 MED ORDER — ONDANSETRON HCL 4 MG/2ML IJ SOLN: 4.0000 mg | Freq: Four times a day (QID) | INTRAMUSCULAR | Status: DC | PRN

## 2013-06-05 MED ORDER — METOCLOPRAMIDE HCL 5 MG/ML IJ SOLN
INTRAMUSCULAR | Status: DC | PRN
  Administered 2013-06-05: 10 mg via INTRAVENOUS

## 2013-06-05 MED ORDER — DEXAMETHASONE SODIUM PHOSPHATE 10 MG/ML IJ SOLN
INTRAMUSCULAR | Status: DC | PRN
  Administered 2013-06-05: 10 mg via INTRAVENOUS

## 2013-06-05 MED ORDER — BUPIVACAINE HCL (PF) 0.25 % IJ SOLN
INTRAMUSCULAR | Status: AC
  Filled 2013-06-05: qty 30

## 2013-06-05 MED ORDER — BUPIVACAINE LIPOSOME 1.3 % IJ SUSP
20.0000 mL | Freq: Once | INTRAMUSCULAR | Status: DC
  Filled 2013-06-05: qty 20

## 2013-06-05 MED ORDER — DEXAMETHASONE SODIUM PHOSPHATE 10 MG/ML IJ SOLN
INTRAMUSCULAR | Status: AC
  Filled 2013-06-05: qty 1

## 2013-06-05 MED ORDER — FENTANYL CITRATE 0.05 MG/ML IJ SOLN
INTRAMUSCULAR | Status: DC | PRN
  Administered 2013-06-05 (×5): 50 ug via INTRAVENOUS

## 2013-06-05 MED ORDER — ZOLPIDEM TARTRATE 5 MG PO TABS: 5.0000 mg | ORAL_TABLET | Freq: Every evening | ORAL | Status: DC | PRN

## 2013-06-05 MED ORDER — CIPROFLOXACIN IN D5W 400 MG/200ML IV SOLN
INTRAVENOUS | Status: AC
  Filled 2013-06-05: qty 200

## 2013-06-05 MED ORDER — OXYCODONE HCL 5 MG PO TABS: 5.0000 mg | ORAL_TABLET | ORAL | Status: DC | PRN

## 2013-06-05 MED ORDER — ROCURONIUM BROMIDE 100 MG/10ML IV SOLN
INTRAVENOUS | Status: AC
  Filled 2013-06-05: qty 1

## 2013-06-05 MED ORDER — MIDAZOLAM HCL 5 MG/5ML IJ SOLN
INTRAMUSCULAR | Status: DC | PRN
  Administered 2013-06-05: 2 mg via INTRAVENOUS

## 2013-06-05 MED ORDER — BUPIVACAINE-EPINEPHRINE 0.25% -1:200000 IJ SOLN: INTRAMUSCULAR | Status: DC | PRN

## 2013-06-05 MED ORDER — METOCLOPRAMIDE HCL 5 MG/ML IJ SOLN
INTRAMUSCULAR | Status: AC
  Filled 2013-06-05: qty 2

## 2013-06-05 MED ORDER — GLYCOPYRROLATE 0.2 MG/ML IJ SOLN
INTRAMUSCULAR | Status: DC | PRN
  Administered 2013-06-05: 0.6 mg via INTRAVENOUS

## 2013-06-05 MED ORDER — SODIUM CHLORIDE 0.9 % IJ SOLN
INTRAMUSCULAR | Status: AC
  Filled 2013-06-05: qty 20

## 2013-06-05 MED ORDER — LACTATED RINGERS IR SOLN
Status: DC | PRN
  Administered 2013-06-05: 1000 mL

## 2013-06-05 MED ORDER — CEFAZOLIN SODIUM-DEXTROSE 2-3 GM-% IV SOLR
INTRAVENOUS | Status: AC
  Filled 2013-06-05: qty 50

## 2013-06-05 MED ORDER — HYDROMORPHONE HCL PF 1 MG/ML IJ SOLN
INTRAMUSCULAR | Status: AC
  Filled 2013-06-05: qty 1

## 2013-06-05 MED ORDER — NEOSTIGMINE METHYLSULFATE 10 MG/10ML IV SOLN
INTRAVENOUS | Status: AC
  Filled 2013-06-05: qty 1

## 2013-06-05 MED ORDER — SODIUM CHLORIDE 0.9 % IV SOLN
INTRAVENOUS | Status: DC | PRN
  Administered 2013-06-05: 20 mL via INTRAMUSCULAR

## 2013-06-05 MED ORDER — DOCUSATE SODIUM 100 MG PO CAPS: 100.0000 mg | ORAL_CAPSULE | Freq: Two times a day (BID) | ORAL | Status: DC

## 2013-06-05 MED ORDER — CEFAZOLIN SODIUM-DEXTROSE 2-3 GM-% IV SOLR
2.0000 g | INTRAVENOUS | Status: AC
  Administered 2013-06-05: 2 g via INTRAVENOUS

## 2013-06-05 MED ORDER — CIPROFLOXACIN IN D5W 400 MG/200ML IV SOLN
400.0000 mg | INTRAVENOUS | Status: AC
  Administered 2013-06-05: 400 mg via INTRAVENOUS

## 2013-06-05 MED ORDER — ONDANSETRON HCL 4 MG/2ML IJ SOLN
INTRAMUSCULAR | Status: DC | PRN
  Administered 2013-06-05: 4 mg via INTRAVENOUS

## 2013-06-05 MED ORDER — LIDOCAINE HCL (CARDIAC) 20 MG/ML IV SOLN
INTRAVENOUS | Status: DC | PRN
  Administered 2013-06-05: 100 mg via INTRAVENOUS

## 2013-06-05 MED ORDER — PANTOPRAZOLE SODIUM 40 MG IV SOLR
40.0000 mg | INTRAVENOUS | Status: DC
  Administered 2013-06-05 – 2013-06-06 (×2): 40 mg via INTRAVENOUS
  Filled 2013-06-05 (×3): qty 40

## 2013-06-05 MED ORDER — LACTATED RINGERS IV SOLN
INTRAVENOUS | Status: DC
  Administered 2013-06-05: 1000 mL via INTRAVENOUS
  Administered 2013-06-05 (×2): via INTRAVENOUS

## 2013-06-05 MED ORDER — BUPIVACAINE LIPOSOME 1.3 % IJ SUSP
INTRAMUSCULAR | Status: DC | PRN
  Administered 2013-06-05: 20 mL

## 2013-06-05 MED ORDER — ACETAMINOPHEN 10 MG/ML IV SOLN
1000.0000 mg | Freq: Four times a day (QID) | INTRAVENOUS | Status: AC
  Administered 2013-06-05 – 2013-06-06 (×4): 1000 mg via INTRAVENOUS
  Filled 2013-06-05 (×4): qty 100

## 2013-06-05 MED ORDER — SODIUM CHLORIDE 0.9 % IV SOLN
INTRAVENOUS | Status: DC
  Administered 2013-06-05 – 2013-06-06 (×3): via INTRAVENOUS

## 2013-06-05 MED ORDER — GLYCOPYRROLATE 0.2 MG/ML IJ SOLN
INTRAMUSCULAR | Status: AC
  Filled 2013-06-05: qty 3

## 2013-06-05 MED ORDER — LACTATED RINGERS IV SOLN: INTRAVENOUS | Status: DC

## 2013-06-05 MED ORDER — MORPHINE SULFATE 2 MG/ML IJ SOLN: 2.0000 mg | INTRAMUSCULAR | Status: DC | PRN

## 2013-06-05 MED ORDER — PROPOFOL 10 MG/ML IV BOLUS
INTRAVENOUS | Status: DC | PRN
  Administered 2013-06-05: 150 mg via INTRAVENOUS

## 2013-06-05 MED ORDER — MIDAZOLAM HCL 2 MG/2ML IJ SOLN
INTRAMUSCULAR | Status: AC
  Filled 2013-06-05: qty 2

## 2013-06-05 MED ORDER — PROPOFOL 10 MG/ML IV BOLUS
INTRAVENOUS | Status: AC
  Filled 2013-06-05: qty 20

## 2013-06-05 MED ORDER — HEPARIN SODIUM (PORCINE) 5000 UNIT/ML IJ SOLN
5000.0000 [IU] | Freq: Three times a day (TID) | INTRAMUSCULAR | Status: DC
  Administered 2013-06-05 – 2013-06-07 (×4): 5000 [IU] via SUBCUTANEOUS
  Filled 2013-06-05 (×8): qty 1

## 2013-06-05 MED ORDER — OXYCODONE HCL 5 MG PO TABS
5.0000 mg | ORAL_TABLET | ORAL | Status: DC | PRN
  Administered 2013-06-06: 5 mg via ORAL
  Filled 2013-06-05: qty 1

## 2013-06-05 MED ORDER — DOCUSATE SODIUM 100 MG PO CAPS
100.0000 mg | ORAL_CAPSULE | Freq: Two times a day (BID) | ORAL | Status: DC
  Administered 2013-06-05 – 2013-06-07 (×5): 100 mg via ORAL
  Filled 2013-06-05 (×6): qty 1

## 2013-06-05 MED ORDER — ROCURONIUM BROMIDE 100 MG/10ML IV SOLN
INTRAVENOUS | Status: DC | PRN
  Administered 2013-06-05: 50 mg via INTRAVENOUS
  Administered 2013-06-05: 10 mg via INTRAVENOUS

## 2013-06-05 MED ORDER — NEOSTIGMINE METHYLSULFATE 10 MG/10ML IV SOLN
INTRAVENOUS | Status: DC | PRN
  Administered 2013-06-05: 5 mg via INTRAVENOUS

## 2013-06-05 MED ORDER — FENTANYL CITRATE 0.05 MG/ML IJ SOLN
INTRAMUSCULAR | Status: AC
  Filled 2013-06-05: qty 5

## 2013-06-05 MED ORDER — OXYCODONE HCL 5 MG PO TABS: 510.0000 mg | ORAL_TABLET | ORAL | Status: DC | PRN

## 2013-06-05 MED ORDER — BUPIVACAINE HCL 0.25 % IJ SOLN
INTRAMUSCULAR | Status: DC | PRN
  Administered 2013-06-05: 10 mL

## 2013-06-05 MED ORDER — HYDROMORPHONE HCL PF 1 MG/ML IJ SOLN
0.2500 mg | INTRAMUSCULAR | Status: DC | PRN
  Administered 2013-06-05 (×2): 0.25 mg via INTRAVENOUS

## 2013-06-05 MED ORDER — ONDANSETRON HCL 4 MG/2ML IJ SOLN
INTRAMUSCULAR | Status: AC
  Filled 2013-06-05: qty 2

## 2013-06-05 MED ORDER — 0.9 % SODIUM CHLORIDE (POUR BTL) OPTIME
TOPICAL | Status: DC | PRN
  Administered 2013-06-05: 1000 mL

## 2013-06-05 SURGICAL SUPPLY — 64 items
APPLIER CLIP ROT 10 11.4 M/L (STAPLE)
BAG ZIPLOCK 12X15 (MISCELLANEOUS) ×3 IMPLANT
BENZOIN TINCTURE PRP APPL 2/3 (GAUZE/BANDAGES/DRESSINGS) IMPLANT
BLADE EXTENDED COATED 6.5IN (ELECTRODE) IMPLANT
BLADE SURG SZ10 CARB STEEL (BLADE) ×3 IMPLANT
CHLORAPREP W/TINT 26ML (MISCELLANEOUS) ×3 IMPLANT
CLIP APPLIE ROT 10 11.4 M/L (STAPLE) IMPLANT
CLIP LIGATING HEM O LOK PURPLE (MISCELLANEOUS) ×3 IMPLANT
CLIP LIGATING HEMO LOK XL GOLD (MISCELLANEOUS) ×3 IMPLANT
CLIP LIGATING HEMO O LOK GREEN (MISCELLANEOUS) ×3 IMPLANT
CLOSURE WOUND 1/4X4 (GAUZE/BANDAGES/DRESSINGS)
COVER SURGICAL LIGHT HANDLE (MISCELLANEOUS) ×3 IMPLANT
CUTTER FLEX LINEAR 45M (STAPLE) ×3 IMPLANT
DERMABOND ADVANCED (GAUZE/BANDAGES/DRESSINGS)
DERMABOND ADVANCED .7 DNX12 (GAUZE/BANDAGES/DRESSINGS) IMPLANT
DRAIN CHANNEL 10F 3/8 F FF (DRAIN) IMPLANT
DRAPE INCISE IOBAN 66X45 STRL (DRAPES) ×3 IMPLANT
DRAPE LAPAROSCOPIC ABDOMINAL (DRAPES) ×3 IMPLANT
ELECT REM PT RETURN 9FT ADLT (ELECTROSURGICAL) ×3
ELECTRODE REM PT RTRN 9FT ADLT (ELECTROSURGICAL) ×1 IMPLANT
EVACUATOR SILICONE 100CC (DRAIN) IMPLANT
FLOSEAL 10ML (HEMOSTASIS) IMPLANT
GLOVE BIOGEL M STRL SZ7.5 (GLOVE) ×3 IMPLANT
GOWN STRL REUS W/TWL LRG LVL3 (GOWN DISPOSABLE) ×6 IMPLANT
HEMOSTAT SURGICEL 2X3 (HEMOSTASIS) ×3 IMPLANT
HEMOSTAT SURGICEL 4X8 (HEMOSTASIS) IMPLANT
KIT BASIN OR (CUSTOM PROCEDURE TRAY) ×3 IMPLANT
MANIFOLD NEPTUNE II (INSTRUMENTS) ×3 IMPLANT
NEEDLE SPNL 22GX2.5 QUINCKE BK (NEEDLE) ×3 IMPLANT
PENCIL BUTTON HOLSTER BLD 10FT (ELECTRODE) ×3 IMPLANT
POSITIONER SURGICAL ARM (MISCELLANEOUS) ×6 IMPLANT
POUCH ENDO CATCH II 15MM (MISCELLANEOUS) ×3 IMPLANT
RELOAD 45 VASCULAR/THIN (ENDOMECHANICALS) ×3 IMPLANT
RELOAD STAPLE TA45 3.5 REG BLU (ENDOMECHANICALS) IMPLANT
RETRACTOR LAPSCP 12X46 CVD (ENDOMECHANICALS) IMPLANT
RTRCTR LAPSCP 12X46 CVD (ENDOMECHANICALS)
SCISSORS LAP 5X35 DISP (ENDOMECHANICALS) ×3 IMPLANT
SEALANT SURGICAL APPL DUAL CAN (MISCELLANEOUS) IMPLANT
SET IRRIG TUBING LAPAROSCOPIC (IRRIGATION / IRRIGATOR) ×3 IMPLANT
SHEARS HARMONIC ACE PLUS 36CM (ENDOMECHANICALS) ×3 IMPLANT
SLEEVE XCEL OPT CAN 5 100 (ENDOMECHANICALS) ×6 IMPLANT
SOLUTION ANTI FOG 6CC (MISCELLANEOUS) ×3 IMPLANT
SPONGE GAUZE 4X4 12PLY (GAUZE/BANDAGES/DRESSINGS) IMPLANT
SPONGE LAP 18X18 X RAY DECT (DISPOSABLE) ×3 IMPLANT
SPONGE LAP 4X18 X RAY DECT (DISPOSABLE) ×3 IMPLANT
SPONGE SURGIFOAM ABS GEL 100 (HEMOSTASIS) IMPLANT
STAPLER VISISTAT 35W (STAPLE) IMPLANT
STRIP CLOSURE SKIN 1/4X4 (GAUZE/BANDAGES/DRESSINGS) IMPLANT
SUT ETHILON 3 0 PS 1 (SUTURE) IMPLANT
SUT MNCRL AB 4-0 PS2 18 (SUTURE) ×6 IMPLANT
SUT PDS AB 1 TP1 96 (SUTURE) ×3 IMPLANT
SUT VIC AB 2-0 CT1 27 (SUTURE)
SUT VIC AB 2-0 CT1 27XBRD (SUTURE) IMPLANT
SUT VICRYL 0 UR6 27IN ABS (SUTURE) ×9 IMPLANT
SYR 20CC LL (SYRINGE) ×3 IMPLANT
TAPE STRIPS DRAPE STRL (GAUZE/BANDAGES/DRESSINGS) ×3 IMPLANT
TOWEL OR 17X26 10 PK STRL BLUE (TOWEL DISPOSABLE) ×3 IMPLANT
TRAY FOLEY CATH 16FR SILVER (SET/KITS/TRAYS/PACK) ×3 IMPLANT
TRAY LAP CHOLE (CUSTOM PROCEDURE TRAY) ×3 IMPLANT
TROCAR BLADELESS OPT 5 100 (ENDOMECHANICALS) ×3 IMPLANT
TROCAR ENDOPATH XCEL 12X100 BL (ENDOMECHANICALS) ×3 IMPLANT
TROCAR XCEL 12X100 BLDLESS (ENDOMECHANICALS) ×3 IMPLANT
TUBING INSUFFLATION 10FT LAP (TUBING) ×3 IMPLANT
YANKAUER SUCT BULB TIP 10FT TU (MISCELLANEOUS) ×3 IMPLANT

## 2013-06-05 NOTE — Discharge Instructions (Signed)

## 2013-06-05 NOTE — Op Note (Signed)
Preoperative diagnosis:  1. Atrophic poorly functioning obstructed left kidney  Postoperative diagnosis:  1. Same   Procedure:  1. Laparoscopic left simple nephrectomy 2. Intraoperative TAPS block  Surgeon: Ardis Hughs, MD Assistant: Franchot Gallo, M.D.  Anesthesia: General  Complications: None  Intraoperative findings: Small atrophic kidney, ureteral stent was removed from the patient's bladder prior to the ligation of the ureter.  EBL: 25 cc  Specimens: None  Indication: Taylor Parks is a 43 y.o. patient with poorly functioning atrophic left kidney secondary to endometriosis long-standing. Patient has had intermittent/serial stent exchanges and her kidney function has not improved. A MAG3 renogram was performed prior to this procedure showing a poorly functioning left kidney. Given the patient's chronic left flank/abdominal pain and her poorly functioning kidney incision was made to proceed with simple nephrectomy. After reviewing the management options for treatment, he elected to proceed with the above surgical procedure(s). We have discussed the potential benefits and risks of the procedure, side effects of the proposed treatment, the likelihood of the patient achieving the goals of the procedure, and any potential problems that might occur during the procedure or recuperation. Informed consent has been obtained.  Description of procedure:  A site was selected inferior to the umbilicus in the midline for placement of the camera port. This was placed using a standard open Hassan technique which allowed entry into the peritoneal cavity under direct vision and without difficulty. A 12 mm Hassan cannula was placed and a pneumoperitoneum established. The camera was then used to inspect the abdomen and there was no evidence of any intra-abdominal injuries or other abnormalities. The remaining abdominal ports were then placed. One 5 mm trocar was placed above the umbilicus in  the midline, the second 12 mm trocar was placed laterally to the camera port so as to triangulate the kidney. A 5 mm assistant port was then placed in the anterior axillary line lateral to the second 12 mm port. The white line of Toldt was incised allowing the colon to be mobilized medially and the plane between the mesocolon and the anterior layer of Gerota's fascia to be developed and the kidney exposed. Dissection proceeded superiorly inferior pole of the kidney until the renal vein and ureter was identified.  The ureter and gonadal vein were identified inferiorly and the ureter was lifted anteriorly off the psoas muscle.   The renal hilum was then carefully isolated with a combination of blunt and sharp dissectiong allowing the renal arterial and venous structures to be separated and isolated.  In order to facilitate dissection of the hilum the kidney was mobilized posteriorly and lifted up towards the anterior abdominal wall. The ureter was transected placing 1 clip on the specimen side and then removing the stents prior to clipping the stay side with large Weck clips.  The renal hilum was then dissected cleanly and the adrenal gland was dissected off the medial aspect of the upper pole. The renal hilum was then taken both artery and vein simultaneously, ligated and divided with a 45 mm Flex ETS stapler. The remaining lateral and superior attachments of the kidney were then freed and the kidney was then fully mobile and freed.  Once the kidney was freed from its attachments it was pushed medially and the vascular hilum was inspected and noted to be sufficiently hemostatic.  Surgicel placed over top of the hilum and adrenal gland. Insufflation was then turned down to 8 mm mercury and the kidney bed was noted to be hemostatic.  40cc of Exparel was then injected into the left anterior axillary line b/w the iliac crest and the twelfth rib under laparoscopic guidance. The layer between the tranversus abdominus and  the internal oblique was targeted.   The kidney/ureter specimen was then placed into a 15 mm Endocatch II retrieval bag, this was passed through the port site of the assistant port and left lower quadrant. The trochars were then removed under visual guidance to ensure no ongoing port site bleeding was occurring. The extraction incision was extended from the 12 mm left lower quadrant port. The external oblique and internal oblique muscles were spread as best as possible with as little muscle fibers ligated as possible in order to safely extracted the specimen. The internal oblique flash of was then closed with 2-0 Vicryl in an interrupted figure-of-eight fashion. The external oblique fascia was closed with a 0 looped PDS. The camera port was then closed with 2-0 Vicryl the level of the fascia. All incisions were injected with Exparel and reapproximated at the skin with 4-0 monocryl sutures. Dermabond was applied to the skin. The patient tolerated the procedure well and without complications and was transferred to the recovery unit in satisfactory condition.   At the end of the case all laps, needles and instruments had been accounted for. The patient subsequently extubated and returned back in excellent condition.

## 2013-06-05 NOTE — Anesthesia Postprocedure Evaluation (Signed)
  Anesthesia Post-op Note  Patient: Research scientist (life sciences)  Procedure(s) Performed: Procedure(s) (LRB): SIMPLE LAPAROSCOPIC LEFT NEPHRECTOMY (Left)  Patient Location: PACU  Anesthesia Type: General  Level of Consciousness: awake and alert   Airway and Oxygen Therapy: Patient Spontanous Breathing  Post-op Pain: mild  Post-op Assessment: Post-op Vital signs reviewed, Patient's Cardiovascular Status Stable, Respiratory Function Stable, Patent Airway and No signs of Nausea or vomiting  Last Vitals:  Filed Vitals:   06/05/13 1156  BP: 124/70  Pulse: 67  Temp: 36.7 C  Resp: 10    Post-op Vital Signs: stable   Complications: No apparent anesthesia complications

## 2013-06-05 NOTE — Transfer of Care (Signed)
Immediate Anesthesia Transfer of Care Note  Patient: Taylor Parks  Procedure(s) Performed: Procedure(s) (LRB): SIMPLE LAPAROSCOPIC LEFT NEPHRECTOMY (Left)  Patient Location: PACU  Anesthesia Type: General  Level of Consciousness: sedated, patient cooperative and responds to stimulation  Airway & Oxygen Therapy: Patient Spontanous Breathing and Patient connected to face mask oxgen  Post-op Assessment: Report given to PACU RN and Post -op Vital signs reviewed and stable  Post vital signs: Reviewed and stable  Complications: No apparent anesthesia complications

## 2013-06-05 NOTE — Anesthesia Preprocedure Evaluation (Addendum)
Anesthesia Evaluation  Patient identified by MRN, date of birth, ID band Patient awake    Reviewed: Allergy & Precautions, H&P , NPO status , Patient's Chart, lab work & pertinent test results  Airway Mallampati: II TM Distance: >3 FB Neck ROM: Full    Dental no notable dental hx. (+) Teeth Intact, Dental Advisory Given   Pulmonary neg pulmonary ROS,  breath sounds clear to auscultation  Pulmonary exam normal       Cardiovascular negative cardio ROS  Rhythm:Regular Rate:Normal     Neuro/Psych negative neurological ROS  negative psych ROS   GI/Hepatic negative GI ROS, Neg liver ROS,   Endo/Other  negative endocrine ROS  Renal/GU Renal disease  negative genitourinary   Musculoskeletal negative musculoskeletal ROS (+)   Abdominal   Peds negative pediatric ROS (+)  Hematology negative hematology ROS (+)   Anesthesia Other Findings   Reproductive/Obstetrics negative OB ROS                          Anesthesia Physical Anesthesia Plan  ASA: II  Anesthesia Plan: General   Post-op Pain Management:    Induction: Intravenous  Airway Management Planned: Oral ETT  Additional Equipment:   Intra-op Plan:   Post-operative Plan: Extubation in OR  Informed Consent:   Plan Discussed with: Surgeon  Anesthesia Plan Comments:         Anesthesia Quick Evaluation

## 2013-06-06 LAB — CBC
HEMATOCRIT: 32 % — AB (ref 36.0–46.0)
Hemoglobin: 10.3 g/dL — ABNORMAL LOW (ref 12.0–15.0)
MCH: 26.2 pg (ref 26.0–34.0)
MCHC: 32.2 g/dL (ref 30.0–36.0)
MCV: 81.4 fL (ref 78.0–100.0)
Platelets: 208 10*3/uL (ref 150–400)
RBC: 3.93 MIL/uL (ref 3.87–5.11)
RDW: 12.2 % (ref 11.5–15.5)
WBC: 10.6 10*3/uL — AB (ref 4.0–10.5)

## 2013-06-06 LAB — BASIC METABOLIC PANEL
BUN: 12 mg/dL (ref 6–23)
CO2: 23 meq/L (ref 19–32)
CREATININE: 1.28 mg/dL — AB (ref 0.50–1.10)
Calcium: 9 mg/dL (ref 8.4–10.5)
Chloride: 103 mEq/L (ref 96–112)
GFR calc Af Amer: 58 mL/min — ABNORMAL LOW (ref >90)
GFR calc non Af Amer: 50 mL/min — ABNORMAL LOW (ref >90)
Glucose, Bld: 114 mg/dL — ABNORMAL HIGH (ref 70–99)
Potassium: 4.4 mEq/L (ref 3.7–5.3)
Sodium: 135 mEq/L — ABNORMAL LOW (ref 137–147)

## 2013-06-06 NOTE — Progress Notes (Signed)
1 Day Post-Op  Subjective:  1 - Left Atrophic Kidney - s/p laparoscopic left nephrectomy 06/05/13 for atrophic left kidney. Hgb 10.3, Cr 1.2 5/9.   Today Taylor Parks is w/o complaints. Ambulated some last PM. No n/v. No wound issues.   Objective: Vital signs in last 24 hours: Temp:  [97.5 F (36.4 C)-98.8 F (37.1 C)] 98.8 F (37.1 C) (05/09 0540) Pulse Rate:  [56-72] 62 (05/09 0540) Resp:  [10-18] 18 (05/09 0540) BP: (121-128)/(67-78) 123/74 mmHg (05/09 0540) SpO2:  [100 %] 100 % (05/09 0540) Weight:  [68.493 kg (151 lb)] 68.493 kg (151 lb) (05/08 1423) Last BM Date: 06/05/13  Intake/Output from previous day: 05/08 0701 - 05/09 0700 In: 4626.7 [I.V.:4476.7; IV Piggyback:150] Out: 15 [Urine:605; Blood:25] Intake/Output this shift:    General appearance: alert, cooperative and appears stated age Head: Normocephalic, without obvious abnormality, atraumatic Throat: lips, mucosa, and tongue normal; teeth and gums normal Neck: supple, symmetrical, trachea midline Back: symmetric, no curvature. ROM normal. No CVA tenderness. Resp: No labored breathing Cardio: regular rate and rhythm, S1, S2 normal, no murmur, click, rub or gallop GI: soft, non-tender; bowel sounds normal; no masses,  no organomegaly Extremities: extremities normal, atraumatic, no cyanosis or edema Pulses: 2+ and symmetric Skin: Skin color, texture, turgor normal. No rashes or lesions Lymph nodes: Cervical, supraclavicular, and axillary nodes normal. Neurologic: Grossly normal Incision/Wound: Recent port sites / extraction sites c/d/i. No hernias.  Lab Results:   Recent Labs  06/05/13 1429 06/06/13 0545  WBC 9.5 10.6*  HGB 11.1* 10.3*  HCT 35.1* 32.0*  PLT 195 208   BMET  Recent Labs  06/05/13 1429 06/06/13 0545  NA 135* 135*  K 4.0 4.4  CL 96 103  CO2 24 23  GLUCOSE 147* 114*  BUN 13 12  CREATININE 1.10 1.28*  CALCIUM 9.2 9.0   PT/INR No results found for this basename: LABPROT, INR,  in the  last 72 hours ABG No results found for this basename: PHART, PCO2, PO2, HCO3,  in the last 72 hours  Studies/Results: No results found.  Anti-infectives: Anti-infectives   Start     Dose/Rate Route Frequency Ordered Stop   06/05/13 0659  ceFAZolin (ANCEF) IVPB 2 g/50 mL premix     2 g 100 mL/hr over 30 Minutes Intravenous 30 min pre-op 06/05/13 0659 06/05/13 0845   06/05/13 0659  ciprofloxacin (CIPRO) IVPB 400 mg     400 mg 200 mL/hr over 60 Minutes Intravenous 60 min pre-op 06/05/13 0659 06/05/13 0910      Assessment/Plan:   1 - Left Atrophic Kidney - doing very well POD 1. Ambulate, regular diet, likely DC home tomorrow.  Alexis Frock 06/06/2013

## 2013-06-07 NOTE — Discharge Summary (Signed)
Physician Discharge Summary  Patient ID: Taylor Parks MRN: [id number] DOB/AGE: [birth year] 43 y.o.  Admit date: 06/05/2013 Discharge date: 06/07/2013  Admission Diagnoses: Left Atrophic Kidney  Discharge Diagnoses:  Active Problems:   Atrophic kidney   Discharged Condition: good  Hospital Course:    1 - Left Atrophic Kidney - s/p laparoscopic left nephrectomy 06/05/13 for atrophic left kidney. Hgb 10.3, Cr 1.2 5/9. By 5/10, the day of discharge, pt ambulatory, pain controlled with PO meds, tolerating regular diet and felt to be adequate for discharge. Final Path pending.     Consults: None  Significant Diagnostic Studies: labs: as per above  Treatments: surgery:  laparoscopic left nephrectomy 06/05/13 for atrophic left kidney  Discharge Exam: Blood pressure 120/73, pulse 75, temperature 98.8 F (37.1 C), temperature source Oral, resp. rate 18, height 5\' 9"  (1.753 m), weight 68.493 kg (151 lb), SpO2 100.00%. General appearance: alert, cooperative and appears stated age Head: Normocephalic, without obvious abnormality, atraumatic Nose: Nares normal. Septum midline. Mucosa normal. No drainage or sinus tenderness. Throat: lips, mucosa, and tongue normal; teeth and gums normal Neck: supple, symmetrical, trachea midline Back: symmetric, no curvature. ROM normal. No CVA tenderness. Resp: No labored breathing Chest wall: no tenderness Cardio: regular rate and rhythm, S1, S2 normal, no murmur, click, rub or gallop GI: soft, non-tender; bowel sounds normal; no masses,  no organomegaly Extremities: extremities normal, atraumatic, no cyanosis or edema Pulses: 2+ and symmetric Skin: Skin color, texture, turgor normal. No rashes or lesions Lymph nodes: Cervical, supraclavicular, and axillary nodes normal. Neurologic: Grossly normal Incision/Wound: Recent port sites and extraction sites c/d/i. No hernias.   Disposition: 01-Home or Self Care     Medication List    STOP taking these  medications       acetaminophen-codeine 300-30 MG per tablet  Commonly known as:  TYLENOL #3      TAKE these medications       BIOTIN PO  Take 1 tablet by mouth 3 (three) times daily.     docusate sodium 100 MG capsule  Commonly known as:  COLACE  Take 1 capsule (100 mg total) by mouth 2 (two) times daily.     ferrous fumarate 325 (106 FE) MG Tabs tablet  Commonly known as:  HEMOCYTE - 106 mg FE  Take 1 tablet by mouth.     oxyCODONE 5 MG immediate release tablet  Commonly known as:  ROXICODONE  Take 1-2 tablets (5-10 mg total) by mouth every 4 (four) hours as needed for moderate pain.           Follow-up Information   Follow up with Taylor Hughs, MD On 06/19/2013. (3:15pm)    Specialty:  Urology   Contact information:   [facility]  [place] [address] [phone]       Signed: Alexis Frock 06/07/2013, 7:47 AM

## 2013-06-08 ENCOUNTER — Encounter (HOSPITAL_COMMUNITY): Payer: Self-pay | Admitting: Urology

## 2014-07-27 ENCOUNTER — Ambulatory Visit
Admission: RE | Admit: 2014-07-27 | Discharge: 2014-07-27 | Disposition: A | Discharge status: Home / Self Care | Payer: BLUE CROSS/BLUE SHIELD | Source: Ambulatory Visit | Attending: Internal Medicine | Admitting: Internal Medicine

## 2014-07-27 ENCOUNTER — Other Ambulatory Visit: Payer: Self-pay | Admitting: Internal Medicine

## 2014-07-27 DIAGNOSIS — M25461 Effusion, right knee: Secondary | ICD-10-CM

## 2014-08-11 ENCOUNTER — Telehealth: Payer: Self-pay | Admitting: Hematology and Oncology

## 2014-08-11 ENCOUNTER — Other Ambulatory Visit: Payer: Self-pay | Admitting: Hematology and Oncology

## 2014-08-11 NOTE — Telephone Encounter (Signed)
new patient appt-s/w patient and gave np appt for 07/26 @ 1:30 w/Dr. Alvy Bimler Referring Dr. Durward Fortes Dx-Monoclonal Gammopathy  Information scanned

## 2014-08-24 ENCOUNTER — Ambulatory Visit (HOSPITAL_BASED_OUTPATIENT_CLINIC_OR_DEPARTMENT_OTHER): Payer: BLUE CROSS/BLUE SHIELD | Admitting: Hematology and Oncology

## 2014-08-24 ENCOUNTER — Encounter: Payer: Self-pay | Admitting: Hematology and Oncology

## 2014-08-24 ENCOUNTER — Telehealth: Payer: Self-pay | Admitting: Hematology and Oncology

## 2014-08-24 ENCOUNTER — Ambulatory Visit: Payer: BLUE CROSS/BLUE SHIELD

## 2014-08-24 VITALS — BP 147/82 | HR 82 | Temp 98.3°F | Resp 18 | Ht 69.0 in | Wt 160.1 lb

## 2014-08-24 DIAGNOSIS — D638 Anemia in other chronic diseases classified elsewhere: Secondary | ICD-10-CM

## 2014-08-24 DIAGNOSIS — D472 Monoclonal gammopathy: Secondary | ICD-10-CM | POA: Diagnosis not present

## 2014-08-24 HISTORY — DX: Monoclonal gammopathy: D47.2

## 2014-08-24 NOTE — Assessment & Plan Note (Addendum)
To rule out multiple myeloma, I recommend complete blood work, 24 hour urine collection for UPEP and skeletal survey to rule out multiple myeloma Depending on test results, we may or may not proceed with bone marrow aspirate and biopsy. However, due to her work schedule, I would defer 24-hour urine collection this time.

## 2014-08-24 NOTE — Telephone Encounter (Signed)
Gave patient avs report and appointments for July and August.  °

## 2014-08-24 NOTE — Assessment & Plan Note (Signed)
She has chronic anemia. She had prior history of significant menorrhagia with endometriosis and certainly the anemia could be due to iron deficiency. I will order additional tests for further evaluation.

## 2014-08-24 NOTE — Telephone Encounter (Signed)
Add to previous note. [facility] will contact patient re bone survey.

## 2014-08-24 NOTE — Progress Notes (Signed)
[facility] CONSULT NOTE  Patient Care Team: Renford Dills, MD as PCP - General (Internal Medicine)  CHIEF COMPLAINTS/PURPOSE OF CONSULTATION:  MGUS  HISTORY OF PRESENTING ILLNESS:  Taylor Parks 44 y.o. female is here because of recent finding of MGUS. The patient has chronic history of joint pain. Over the last few months, she have progressive acute on chronic right knee pain prominent to orthopedic evaluation. The physician ordered serum protein electrophoresis an abnormal 1.4 g comes back was detected and hence she was referred here for further evaluation. The right knee is still causing significant pain but she is not taking any pain medicine She denies history of bone fracture. Patient denies history of recurrent infection or atypical infections such as shingles of meningitis. Denies chills, night sweats, anorexia or abnormal weight loss.  MEDICAL HISTORY:  Past Medical History  Diagnosis Date  . Uterine leiomyoma   . Endometriosis of pelvic peritoneum   . Abnormal uterine bleeding (AUB)   . Wears contact lenses   . Renal atrophy, left     ONLY 19% FUNCTIONING  . Renal stone     LEFT LOW POLE NON-OBSTRUCTING , SMALL  . Hydronephrosis, left     CHRONIC  . Anemia   . MGUS (monoclonal gammopathy of unknown significance) 08/24/2014    SURGICAL HISTORY: Past Surgical History  Procedure Laterality Date  . Foot surgery Left 2013  . Cesarean section  OCT 1994  . Laparoscopy/ fulgeration endometriosis/ lysis adhesions/ d & c hysteroscopy with resection polyp  03-27-2001  . Cysto/ left retrograde pyelogram/  left ureteroscopy/  ureteral stent placement  10-24-2007/  04-25-2007/  10-11-2006  . Intrauterine device insertion  07-30-2012    MIRENA  . Hammer toe surgery Left 01-29-2012    and repair old toe fx  . Cystoscopy with stent placement Left 11/25/2012    Procedure: CYSTOSCOPY WITH STENT PLACEMENT;  Surgeon: Lindaann Slough, MD;  [facility];  Service: Urology;  Laterality: Left;  . Cystoscopy w/ retrogrades  11/25/2012    Procedure: CYSTOSCOPY WITH RETROGRADE PYELOGRAM;  Surgeon: Lindaann Slough, MD;  Location: [facility];  Service: Urology;;  . Laparoscopic nephrectomy Left 06/05/2013    Procedure: SIMPLE LAPAROSCOPIC LEFT NEPHRECTOMY;  Surgeon: Crist Fat, MD;  Location: [facility];  Service: Urology;  Laterality: Left;    SOCIAL HISTORY: History   Social History  . Marital Status: Single    Spouse Name: N/A  . Number of Children: N/A  . Years of Education: N/A   Occupational History  . Retail    Social History Main Topics  . Smoking status: Never Smoker   . Smokeless tobacco: Never Used  . Alcohol Use: No  . Drug Use: No  . Sexual Activity: Not on file   Other Topics Concern  . Not on file   Social History Narrative    FAMILY HISTORY: Family History  Problem Relation Age of Onset  . Asthma    . Cataracts    . Diabetes    . Alzheimer's disease Mother     ALLERGIES:  is allergic to aleve.  MEDICATIONS:  No current outpatient prescriptions on file.   No current facility-administered medications for this visit.    REVIEW OF SYSTEMS:   Eyes: Denies blurriness of vision, double vision or watery eyes Ears, nose, mouth, throat, and face: Denies mucositis or sore throat Respiratory: Denies cough, dyspnea or wheezes Cardiovascular: Denies palpitation, chest discomfort or lower extremity swelling Gastrointestinal:  Denies nausea, heartburn or change in bowel habits Skin: Denies abnormal skin rashes Lymphatics: Denies new lymphadenopathy or easy bruising Neurological:Denies numbness, tingling or new weaknesses Behavioral/Psych: Mood is stable, no new changes  All other systems were reviewed with the patient and are negative.  PHYSICAL EXAMINATION: ECOG PERFORMANCE STATUS: 1 - Symptomatic but completely ambulatory  Filed Vitals:   08/24/14 1409  BP: 147/82  Pulse:  82  Temp: 98.3 F (36.8 C)  Resp: 18   Filed Weights   08/24/14 1409  Weight: 160 lb 1.6 oz (72.621 kg)    GENERAL:alert, no distress and comfortable SKIN: skin color, texture, turgor are normal, no rashes or significant lesions EYES: normal, conjunctiva are pink and non-injected, sclera clear OROPHARYNX:no exudate, no erythema and lips, buccal mucosa, and tongue normal  NECK: supple, thyroid normal size, non-tender, without nodularity LYMPH:  no palpable lymphadenopathy in the cervical, axillary or inguinal LUNGS: clear to auscultation and percussion with normal breathing effort HEART: regular rate & rhythm and no murmurs and no lower extremity edema ABDOMEN:abdomen soft, non-tender and normal bowel sounds Musculoskeletal:no cyanosis of digits and no clubbing  PSYCH: alert & oriented x 3 with fluent speech NEURO: no focal motor/sensory deficits  LABORATORY DATA:  I have reviewed the data as listed Lab Results  Component Value Date   WBC 10.6* 06/06/2013   HGB 10.3* 06/06/2013   HCT 32.0* 06/06/2013   MCV 81.4 06/06/2013   PLT 208 06/06/2013    RADIOGRAPHIC STUDIES: I have personally reviewed the radiological images as listed and agreed with the findings in the report. Dg Knee Complete 4 Views Right  07/27/2014   CLINICAL DATA:  Right knee pain for 3 days, no known injury, initial encounter  EXAM: RIGHT KNEE - COMPLETE 4+ VIEW  COMPARISON:  07/07/2008  FINDINGS: No acute fracture or dislocation is noted. A small joint effusion is seen. No significant spurring is noted.  IMPRESSION: Small joint effusion without acute abnormality.   Electronically Signed   By: Inez Catalina M.D.   On: 07/27/2014 16:18    ASSESSMENT & PLAN:  MGUS (monoclonal gammopathy of unknown significance) To rule out multiple myeloma, I recommend complete blood work, 24 hour urine collection for UPEP and skeletal survey to rule out multiple myeloma Depending on test results, we may or may not proceed with  bone marrow aspirate and biopsy. However, due to her work schedule, I would defer 24-hour urine collection this time.  Anemia of chronic disease She has chronic anemia. She had prior history of significant menorrhagia with endometriosis and certainly the anemia could be due to iron deficiency. I will order additional tests for further evaluation.     Orders Placed This Encounter  Procedures  . DG Bone Survey Met    Standing Status: Future     Number of Occurrences:      Standing Expiration Date: 10/24/2015    Order Specific Question:  Reason for Exam (SYMPTOM  OR DIAGNOSIS REQUIRED)    Answer:  staging myeloma    Order Specific Question:  Is the patient pregnant?    Answer:  No    Order Specific Question:  Preferred imaging location?    Answer:  [facility]  . Comprehensive metabolic panel    Standing Status: Future     Number of Occurrences:      Standing Expiration Date: 10/24/2015  . CBC with Differential/Platelet    Standing Status: Future     Number of Occurrences:  Standing Expiration Date: 10/24/2015  . Lactate dehydrogenase    Standing Status: Future     Number of Occurrences:      Standing Expiration Date: 10/24/2015  . Beta 2 microglobulin, serum    Standing Status: Future     Number of Occurrences:      Standing Expiration Date: 10/24/2015  . SPEP & IFE with QIG    Standing Status: Future     Number of Occurrences:      Standing Expiration Date: 10/24/2015  . Kappa/lambda light chains    Standing Status: Future     Number of Occurrences:      Standing Expiration Date: 10/24/2015    All questions were answered. The patient knows to call the clinic with any problems, questions or concerns. I spent 40 minutes counseling the patient face to face. The total time spent in the appointment was 55 minutes and more than 50% was on counseling.     [facility], Taylor Hardie, MD 08/24/2014 5:12 PM

## 2014-08-24 NOTE — Progress Notes (Signed)
Checked in new pt with no financial concerns. Pt has my card for any billing questions,concerns,or fin assistance.

## 2014-08-26 ENCOUNTER — Ambulatory Visit (HOSPITAL_COMMUNITY)
Admission: RE | Admit: 2014-08-26 | Discharge: 2014-08-26 | Disposition: A | Discharge status: Home / Self Care | Payer: BLUE CROSS/BLUE SHIELD | Source: Ambulatory Visit | Attending: Hematology and Oncology | Admitting: Hematology and Oncology

## 2014-08-26 ENCOUNTER — Other Ambulatory Visit (HOSPITAL_BASED_OUTPATIENT_CLINIC_OR_DEPARTMENT_OTHER): Payer: BLUE CROSS/BLUE SHIELD

## 2014-08-26 DIAGNOSIS — D472 Monoclonal gammopathy: Secondary | ICD-10-CM

## 2014-08-26 DIAGNOSIS — D638 Anemia in other chronic diseases classified elsewhere: Secondary | ICD-10-CM

## 2014-08-26 LAB — CBC WITH DIFFERENTIAL/PLATELET
BASO%: 0.5 % (ref 0.0–2.0)
Basophils Absolute: 0 10*3/uL (ref 0.0–0.1)
EOS ABS: 0.1 10*3/uL (ref 0.0–0.5)
EOS%: 1.1 % (ref 0.0–7.0)
HCT: 35.9 % (ref 34.8–46.6)
HEMOGLOBIN: 11.1 g/dL — AB (ref 11.6–15.9)
LYMPH#: 1.8 10*3/uL (ref 0.9–3.3)
LYMPH%: 34.1 % (ref 14.0–49.7)
MCH: 25.7 pg (ref 25.1–34.0)
MCHC: 30.8 g/dL — ABNORMAL LOW (ref 31.5–36.0)
MCV: 83.5 fL (ref 79.5–101.0)
MONO#: 0.3 10*3/uL (ref 0.1–0.9)
MONO%: 6.7 % (ref 0.0–14.0)
NEUT#: 3 10*3/uL (ref 1.5–6.5)
NEUT%: 57.6 % (ref 38.4–76.8)
Platelets: 198 10*3/uL (ref 145–400)
RBC: 4.3 10*6/uL (ref 3.70–5.45)
RDW: 13.2 % (ref 11.2–14.5)
WBC: 5.2 10*3/uL (ref 3.9–10.3)

## 2014-08-26 LAB — IRON AND TIBC CHCC
%SAT: 25 % (ref 21–57)
IRON: 57 ug/dL (ref 41–142)
TIBC: 224 ug/dL — ABNORMAL LOW (ref 236–444)
UIBC: 167 ug/dL (ref 120–384)

## 2014-08-26 LAB — COMPREHENSIVE METABOLIC PANEL (CC13)
ALBUMIN: 3.7 g/dL (ref 3.5–5.0)
ALT: 8 U/L (ref 0–55)
ANION GAP: 6 meq/L (ref 3–11)
AST: 13 U/L (ref 5–34)
Alkaline Phosphatase: 58 U/L (ref 40–150)
BILIRUBIN TOTAL: 0.51 mg/dL (ref 0.20–1.20)
BUN: 12.8 mg/dL (ref 7.0–26.0)
CHLORIDE: 105 meq/L (ref 98–109)
CO2: 26 mEq/L (ref 22–29)
Calcium: 9.6 mg/dL (ref 8.4–10.4)
Creatinine: 1.1 mg/dL (ref 0.6–1.1)
EGFR: 72 mL/min/{1.73_m2} — ABNORMAL LOW (ref >90)
GLUCOSE: 87 mg/dL (ref 70–140)
Potassium: 3.8 mEq/L (ref 3.5–5.1)
SODIUM: 138 meq/L (ref 136–145)
TOTAL PROTEIN: 8 g/dL (ref 6.4–8.3)

## 2014-08-26 LAB — FERRITIN CHCC: Ferritin: 142 ng/ml (ref 9–269)

## 2014-08-26 LAB — LACTATE DEHYDROGENASE (CC13): LDH: 116 U/L — ABNORMAL LOW (ref 125–245)

## 2014-08-30 LAB — SPEP & IFE WITH QIG
Abnormal Protein Band1: 1.6 g/dL
Albumin ELP: 4.1 g/dL (ref 3.8–4.8)
Alpha-1-Globulin: 0.3 g/dL (ref 0.2–0.3)
Alpha-2-Globulin: 0.7 g/dL (ref 0.5–0.9)
Beta 2: 0.3 g/dL (ref 0.2–0.5)
Beta Globulin: 0.4 g/dL (ref 0.4–0.6)
Gamma Globulin: 2.4 g/dL — ABNORMAL HIGH (ref 0.8–1.7)
IGM, SERUM: 48 mg/dL — AB (ref 52–322)
IgA: 96 mg/dL (ref 69–380)
IgG (Immunoglobin G), Serum: 2560 mg/dL — ABNORMAL HIGH (ref 690–1700)
Total Protein, Serum Electrophoresis: 8 g/dL (ref 6.1–8.1)

## 2014-08-30 LAB — KAPPA/LAMBDA LIGHT CHAINS
KAPPA LAMBDA RATIO: 0.54 (ref 0.26–1.65)
Kappa free light chain: 2.94 mg/dL — ABNORMAL HIGH (ref 0.33–1.94)
Lambda Free Lght Chn: 5.45 mg/dL — ABNORMAL HIGH (ref 0.57–2.63)

## 2014-08-30 LAB — BETA 2 MICROGLOBULIN, SERUM: BETA 2 MICROGLOBULIN: 2.2 mg/L (ref <=2.51)

## 2014-08-30 LAB — SEDIMENTATION RATE: Sed Rate: 9 mm/hr (ref 0–20)

## 2014-09-06 ENCOUNTER — Telehealth: Payer: Self-pay | Admitting: Hematology and Oncology

## 2014-09-06 ENCOUNTER — Ambulatory Visit (HOSPITAL_BASED_OUTPATIENT_CLINIC_OR_DEPARTMENT_OTHER): Payer: BLUE CROSS/BLUE SHIELD | Admitting: Hematology and Oncology

## 2014-09-06 VITALS — BP 123/76 | HR 80 | Temp 98.1°F | Resp 18 | Ht 69.0 in | Wt 165.4 lb

## 2014-09-06 DIAGNOSIS — D472 Monoclonal gammopathy: Secondary | ICD-10-CM

## 2014-09-06 DIAGNOSIS — D638 Anemia in other chronic diseases classified elsewhere: Secondary | ICD-10-CM | POA: Diagnosis not present

## 2014-09-06 NOTE — Telephone Encounter (Signed)
Pt confirmed labs/ov per 08/08 POF, gave pt avs and calendar.... KJ °

## 2014-09-06 NOTE — Assessment & Plan Note (Signed)
So far, her test results do not suggest multiple myeloma. I recommend history, physical examination and blood work in one year. I educated the patient signs and symptoms to watch out for multiple myeloma. We discussed the natural history of multiple myeloma in the rationale of surveillance right now since she has no evidence of organ damage.

## 2014-09-06 NOTE — Assessment & Plan Note (Signed)
She has chronic anemia.  I suspect the cause is from anemia of chronic disease. Iron studies were adequate.

## 2014-09-06 NOTE — Progress Notes (Signed)
[facility] OFFICE PROGRESS NOTE  Patient Care Team: Seward Carol, MD as PCP - General (Internal Medicine)  SUMMARY OF ONCOLOGIC HISTORY: Taylor Parks  Was referred here because of recent finding of MGUS. The patient has chronic history of joint pain. Over the last few months, she have progressive acute on chronic right knee pain prominent to orthopedic evaluation. The physician ordered serum protein electrophoresis an abnormal 1.4 g comes back was detected and hence she was referred here for further evaluation. The right knee is still causing significant pain but she is not taking any pain medicine She denies history of bone fracture. Patient denies history of recurrent infection or atypical infections such as shingles of meningitis. Denies chills, night sweats, anorexia or abnormal weight loss.  INTERVAL HISTORY: Please see below for problem oriented charting.  she returns for further follow-up. She had no new complaints.  REVIEW OF SYSTEMS:   Constitutional: Denies fevers, chills or abnormal weight loss Eyes: Denies blurriness of vision Ears, nose, mouth, throat, and face: Denies mucositis or sore throat Respiratory: Denies cough, dyspnea or wheezes Cardiovascular: Denies palpitation, chest discomfort or lower extremity swelling Gastrointestinal:  Denies nausea, heartburn or change in bowel habits Skin: Denies abnormal skin rashes Lymphatics: Denies new lymphadenopathy or easy bruising Neurological:Denies numbness, tingling or new weaknesses Behavioral/Psych: Mood is stable, no new changes  All other systems were reviewed with the patient and are negative.  I have reviewed the past medical history, past surgical history, social history and family history with the patient and they are unchanged from previous note.  ALLERGIES:  is allergic to aleve.  MEDICATIONS:  No current outpatient prescriptions on file.   No current facility-administered medications for  this visit.    PHYSICAL EXAMINATION: ECOG PERFORMANCE STATUS: 1 - Symptomatic but completely ambulatory  Filed Vitals:   09/06/14 1507  BP: 123/76  Pulse: 80  Temp: 98.1 F (36.7 C)  Resp: 18   Filed Weights   09/06/14 1507  Weight: 165 lb 6.4 oz (75.025 kg)    GENERAL:alert, no distress and comfortable SKIN: skin color, texture, turgor are normal, no rashes or significant lesions EYES: normal, Conjunctiva are pink and non-injected, sclera clear Musculoskeletal:no cyanosis of digits and no clubbing  NEURO: alert & oriented x 3 with fluent speech, no focal motor/sensory deficits  LABORATORY DATA:  I have reviewed the data as listed    Component Value Date/Time   NA 138 08/26/2014 1430   NA 135* 06/06/2013 0545   K 3.8 08/26/2014 1430   K 4.4 06/06/2013 0545   CL 103 06/06/2013 0545   CO2 26 08/26/2014 1430   CO2 23 06/06/2013 0545   GLUCOSE 87 08/26/2014 1430   GLUCOSE 114* 06/06/2013 0545   BUN 12.8 08/26/2014 1430   BUN 12 06/06/2013 0545   CREATININE 1.1 08/26/2014 1430   CREATININE 1.28* 06/06/2013 0545   CALCIUM 9.6 08/26/2014 1430   CALCIUM 9.0 06/06/2013 0545   PROT 8.0 08/26/2014 1430   PROT 7.4 10/10/2006 0746   ALBUMIN 3.7 08/26/2014 1430   ALBUMIN 3.4* 10/10/2006 0746   AST 13 08/26/2014 1430   AST 14 10/10/2006 0746   ALT 8 08/26/2014 1430   ALT 10 10/10/2006 0746   ALKPHOS 58 08/26/2014 1430   ALKPHOS 44 10/10/2006 0746   BILITOT 0.51 08/26/2014 1430   BILITOT 1.0 10/10/2006 0746   GFRNONAA 50* 06/06/2013 0545   GFRAA 58* 06/06/2013 0545    No results found for: SPEP, UPEP  Lab  Results  Component Value Date   WBC 5.2 08/26/2014   NEUTROABS 3.0 08/26/2014   HGB 11.1* 08/26/2014   HCT 35.9 08/26/2014   MCV 83.5 08/26/2014   PLT 198 08/26/2014      Chemistry      Component Value Date/Time   NA 138 08/26/2014 1430   NA 135* 06/06/2013 0545   K 3.8 08/26/2014 1430   K 4.4 06/06/2013 0545   CL 103 06/06/2013 0545   CO2 26  08/26/2014 1430   CO2 23 06/06/2013 0545   BUN 12.8 08/26/2014 1430   BUN 12 06/06/2013 0545   CREATININE 1.1 08/26/2014 1430   CREATININE 1.28* 06/06/2013 0545      Component Value Date/Time   CALCIUM 9.6 08/26/2014 1430   CALCIUM 9.0 06/06/2013 0545   ALKPHOS 58 08/26/2014 1430   ALKPHOS 44 10/10/2006 0746   AST 13 08/26/2014 1430   AST 14 10/10/2006 0746   ALT 8 08/26/2014 1430   ALT 10 10/10/2006 0746   BILITOT 0.51 08/26/2014 1430   BILITOT 1.0 10/10/2006 0746       RADIOGRAPHIC STUDIES: skeletal survey was negative I have personally reviewed the radiological images as listed and agreed with the findings in the report.    ASSESSMENT & PLAN:  MGUS (monoclonal gammopathy of unknown significance) So far, her test results do not suggest multiple myeloma. I recommend history, physical examination and blood work in one year. I educated the patient signs and symptoms to watch out for multiple myeloma. We discussed the natural history of multiple myeloma in the rationale of surveillance right now since she has no evidence of organ damage.   Anemia of chronic disease She has chronic anemia.  I suspect the cause is from anemia of chronic disease. Iron studies were adequate.    Orders Placed This Encounter  Procedures  . CBC with Differential/Platelet    Standing Status: Future     Number of Occurrences:      Standing Expiration Date: 10/11/2015  . Comprehensive metabolic panel    Standing Status: Future     Number of Occurrences:      Standing Expiration Date: 10/11/2015  . SPEP & IFE with QIG    Standing Status: Future     Number of Occurrences:      Standing Expiration Date: 10/11/2015  . Kappa/lambda light chains    Standing Status: Future     Number of Occurrences:      Standing Expiration Date: 10/11/2015   All questions were answered. The patient knows to call the clinic with any problems, questions or concerns. No barriers to learning was detected. I spent  15 minutes counseling the patient face to face. The total time spent in the appointment was 20 minutes and more than 50% was on counseling and review of test results     [facility], [place], MD 09/06/2014 3:22 PM

## 2015-01-17 ENCOUNTER — Other Ambulatory Visit: Payer: Self-pay | Admitting: Internal Medicine

## 2015-01-17 DIAGNOSIS — R109 Unspecified abdominal pain: Secondary | ICD-10-CM

## 2015-01-17 DIAGNOSIS — N920 Excessive and frequent menstruation with regular cycle: Secondary | ICD-10-CM

## 2015-01-26 ENCOUNTER — Ambulatory Visit
Admission: RE | Admit: 2015-01-26 | Discharge: 2015-01-26 | Disposition: A | Discharge status: Home / Self Care | Payer: BLUE CROSS/BLUE SHIELD | Source: Ambulatory Visit | Attending: Internal Medicine | Admitting: Internal Medicine

## 2015-01-26 DIAGNOSIS — R109 Unspecified abdominal pain: Secondary | ICD-10-CM

## 2015-01-26 DIAGNOSIS — N920 Excessive and frequent menstruation with regular cycle: Secondary | ICD-10-CM

## 2015-01-28 ENCOUNTER — Ambulatory Visit
Admission: RE | Admit: 2015-01-28 | Discharge: 2015-01-28 | Disposition: A | Discharge status: Home / Self Care | Payer: BLUE CROSS/BLUE SHIELD | Source: Ambulatory Visit | Attending: Internal Medicine | Admitting: Internal Medicine

## 2015-04-13 ENCOUNTER — Telehealth: Payer: Self-pay | Admitting: Hematology and Oncology

## 2015-04-13 NOTE — Telephone Encounter (Signed)
reutnred call and s.w. pt and confirmed appts....pt ok and aware

## 2015-08-30 ENCOUNTER — Other Ambulatory Visit (HOSPITAL_BASED_OUTPATIENT_CLINIC_OR_DEPARTMENT_OTHER): Payer: BLUE CROSS/BLUE SHIELD

## 2015-08-30 DIAGNOSIS — D472 Monoclonal gammopathy: Secondary | ICD-10-CM

## 2015-08-30 LAB — CBC WITH DIFFERENTIAL/PLATELET
BASO%: 0.8 % (ref 0.0–2.0)
Basophils Absolute: 0 10*3/uL (ref 0.0–0.1)
EOS ABS: 0.1 10*3/uL (ref 0.0–0.5)
EOS%: 1.8 % (ref 0.0–7.0)
HCT: 35.1 % (ref 34.8–46.6)
HEMOGLOBIN: 10.9 g/dL — AB (ref 11.6–15.9)
LYMPH%: 41 % (ref 14.0–49.7)
MCH: 25.8 pg (ref 25.1–34.0)
MCHC: 31.1 g/dL — ABNORMAL LOW (ref 31.5–36.0)
MCV: 82.8 fL (ref 79.5–101.0)
MONO#: 0.3 10*3/uL (ref 0.1–0.9)
MONO%: 7.5 % (ref 0.0–14.0)
NEUT%: 48.9 % (ref 38.4–76.8)
NEUTROS ABS: 2.3 10*3/uL (ref 1.5–6.5)
Platelets: 172 10*3/uL (ref 145–400)
RBC: 4.24 10*6/uL (ref 3.70–5.45)
RDW: 13.1 % (ref 11.2–14.5)
WBC: 4.6 10*3/uL (ref 3.9–10.3)
lymph#: 1.9 10*3/uL (ref 0.9–3.3)

## 2015-08-30 LAB — COMPREHENSIVE METABOLIC PANEL
ALBUMIN: 3.8 g/dL (ref 3.5–5.0)
ALK PHOS: 53 U/L (ref 40–150)
ALT: 12 U/L (ref 0–55)
AST: 16 U/L (ref 5–34)
Anion Gap: 6 mEq/L (ref 3–11)
BILIRUBIN TOTAL: 0.68 mg/dL (ref 0.20–1.20)
BUN: 11.6 mg/dL (ref 7.0–26.0)
CO2: 25 mEq/L (ref 22–29)
CREATININE: 1.1 mg/dL (ref 0.6–1.1)
Calcium: 9.5 mg/dL (ref 8.4–10.4)
Chloride: 105 mEq/L (ref 98–109)
EGFR: 72 mL/min/{1.73_m2} — ABNORMAL LOW (ref >90)
Glucose: 89 mg/dl (ref 70–140)
Potassium: 3.7 mEq/L (ref 3.5–5.1)
Sodium: 137 mEq/L (ref 136–145)
TOTAL PROTEIN: 8.3 g/dL (ref 6.4–8.3)

## 2015-08-31 LAB — KAPPA/LAMBDA LIGHT CHAINS
Ig Kappa Free Light Chain: 15.9 mg/L (ref 3.3–19.4)
Ig Lambda Free Light Chain: 64.8 mg/L — ABNORMAL HIGH (ref 5.7–26.3)
Kappa/Lambda FluidC Ratio: 0.25 — ABNORMAL LOW (ref 0.26–1.65)

## 2015-09-01 LAB — MULTIPLE MYELOMA PANEL, SERUM
ALBUMIN SERPL ELPH-MCNC: 3.8 g/dL (ref 2.9–4.4)
ALPHA2 GLOB SERPL ELPH-MCNC: 0.5 g/dL (ref 0.4–1.0)
Albumin/Glob SerPl: 1 (ref 0.7–1.7)
Alpha 1: 0.2 g/dL (ref 0.0–0.4)
B-GLOBULIN SERPL ELPH-MCNC: 0.8 g/dL (ref 0.7–1.3)
Gamma Glob SerPl Elph-Mcnc: 2.5 g/dL — ABNORMAL HIGH (ref 0.4–1.8)
Globulin, Total: 4 g/dL — ABNORMAL HIGH (ref 2.2–3.9)
IGM (IMMUNOGLOBIN M), SRM: 41 mg/dL (ref 26–217)
IgA, Qn, Serum: 92 mg/dL (ref 87–352)
M PROTEIN SERPL ELPH-MCNC: 1.6 g/dL — AB
Total Protein: 7.8 g/dL (ref 6.0–8.5)

## 2015-09-06 ENCOUNTER — Encounter: Payer: Self-pay | Admitting: Hematology and Oncology

## 2015-09-06 ENCOUNTER — Ambulatory Visit (HOSPITAL_BASED_OUTPATIENT_CLINIC_OR_DEPARTMENT_OTHER): Payer: BLUE CROSS/BLUE SHIELD | Admitting: Hematology and Oncology

## 2015-09-06 ENCOUNTER — Telehealth: Payer: Self-pay | Admitting: Hematology and Oncology

## 2015-09-06 VITALS — BP 145/75 | HR 66 | Temp 98.7°F | Resp 18 | Wt 163.1 lb

## 2015-09-06 DIAGNOSIS — D472 Monoclonal gammopathy: Secondary | ICD-10-CM | POA: Diagnosis not present

## 2015-09-06 DIAGNOSIS — D638 Anemia in other chronic diseases classified elsewhere: Secondary | ICD-10-CM | POA: Diagnosis not present

## 2015-09-06 NOTE — Telephone Encounter (Signed)
Gave pt cal & avs °

## 2015-09-06 NOTE — Assessment & Plan Note (Signed)
She has chronic anemia. I suspect the cause is from anemia of chronic disease. She is not symptomatic. Observe only

## 2015-09-06 NOTE — Progress Notes (Signed)
[facility] OFFICE PROGRESS NOTE  Patient Care Team: Seward Carol, MD as PCP - General (Internal Medicine) Garald Balding, MD as Consulting Physician (Orthopedic Surgery)  SUMMARY OF ONCOLOGIC HISTORY:  Taylor Parks  Was referred here because of recent finding of MGUS. The patient has chronic history of joint pain. Over the last few months, she have progressive acute on chronic right knee pain prominent to orthopedic evaluation. The physician ordered serum protein electrophoresis an abnormal 1.4 g comes back was detected and hence she was referred here for further evaluation. The right knee is still causing significant pain but she is not taking any pain medicine She denies history of bone fracture. Patient denies history of recurrent infection or atypical infections such as shingles of meningitis. Denies chills, night sweats, anorexia or abnormal weight loss.  INTERVAL HISTORY: Please see below for problem oriented charting. She feels well. Denies recent bone pain. No recent infection. The patient denies any recent signs or symptoms of bleeding such as spontaneous epistaxis, hematuria or hematochezia.   REVIEW OF SYSTEMS:   Constitutional: Denies fevers, chills or abnormal weight loss Eyes: Denies blurriness of vision Ears, nose, mouth, throat, and face: Denies mucositis or sore throat Respiratory: Denies cough, dyspnea or wheezes Cardiovascular: Denies palpitation, chest discomfort or lower extremity swelling Gastrointestinal:  Denies nausea, heartburn or change in bowel habits Skin: Denies abnormal skin rashes Lymphatics: Denies new lymphadenopathy or easy bruising Neurological:Denies numbness, tingling or new weaknesses Behavioral/Psych: Mood is stable, no new changes  All other systems were reviewed with the patient and are negative.  I have reviewed the past medical history, past surgical history, social history and family history with the patient and  they are unchanged from previous note.  ALLERGIES:  is allergic to aleve [naproxen sodium].  MEDICATIONS:  No current outpatient prescriptions on file.   No current facility-administered medications for this visit.     PHYSICAL EXAMINATION: ECOG PERFORMANCE STATUS: 0 - Asymptomatic  Vitals:   09/06/15 1500  BP: (!) 145/75  Pulse: 66  Resp: 18  Temp: 98.7 F (37.1 C)   Filed Weights   09/06/15 1500  Weight: 163 lb 1.6 oz (74 kg)    GENERAL:alert, no distress and comfortable SKIN: skin color, texture, turgor are normal, no rashes or significant lesions EYES: normal, Conjunctiva are pink and non-injected, sclera clear OROPHARYNX:no exudate, no erythema and lips, buccal mucosa, and tongue normal  NECK: supple, thyroid normal size, non-tender, without nodularity LYMPH:  no palpable lymphadenopathy in the cervical, axillary or inguinal LUNGS: clear to auscultation and percussion with normal breathing effort HEART: regular rate & rhythm and no murmurs and no lower extremity edema ABDOMEN:abdomen soft, non-tender and normal bowel sounds Musculoskeletal:no cyanosis of digits and no clubbing  NEURO: alert & oriented x 3 with fluent speech, no focal motor/sensory deficits  LABORATORY DATA:  I have reviewed the data as listed    Component Value Date/Time   NA 137 08/30/2015 1309   K 3.7 08/30/2015 1309   CL 103 06/06/2013 0545   CO2 25 08/30/2015 1309   GLUCOSE 89 08/30/2015 1309   BUN 11.6 08/30/2015 1309   CREATININE 1.1 08/30/2015 1309   CALCIUM 9.5 08/30/2015 1309   PROT 7.8 08/30/2015 1309   PROT 8.3 08/30/2015 1309   ALBUMIN 3.8 08/30/2015 1309   AST 16 08/30/2015 1309   ALT 12 08/30/2015 1309   ALKPHOS 53 08/30/2015 1309   BILITOT 0.68 08/30/2015 1309   GFRNONAA 50 (L) 06/06/2013 0545  GFRAA 58 (L) 06/06/2013 0545    No results found for: SPEP, UPEP  Lab Results  Component Value Date   WBC 4.6 08/30/2015   NEUTROABS 2.3 08/30/2015   HGB 10.9 (L)  08/30/2015   HCT 35.1 08/30/2015   MCV 82.8 08/30/2015   PLT 172 08/30/2015      Chemistry      Component Value Date/Time   NA 137 08/30/2015 1309   K 3.7 08/30/2015 1309   CL 103 06/06/2013 0545   CO2 25 08/30/2015 1309   BUN 11.6 08/30/2015 1309   CREATININE 1.1 08/30/2015 1309      Component Value Date/Time   CALCIUM 9.5 08/30/2015 1309   ALKPHOS 53 08/30/2015 1309   AST 16 08/30/2015 1309   ALT 12 08/30/2015 1309   BILITOT 0.68 08/30/2015 1309      ASSESSMENT & PLAN:  MGUS (monoclonal gammopathy of unknown significance) So far, her test results are consistent with MGUS. I recommend history, physical examination and blood work in one year. I educated the patient signs and symptoms to watch out for multiple myeloma. We discussed the natural history of multiple myeloma in the rationale of surveillance right now since she has no evidence of organ damage. I recommend vitamin D supplement   Anemia of chronic disease She has chronic anemia. I suspect the cause is from anemia of chronic disease. She is not symptomatic. Observe only   Orders Placed This Encounter  Procedures  . CBC with Differential/Platelet    Standing Status:   Future    Standing Expiration Date:   10/10/2016  . Comprehensive metabolic panel    Standing Status:   Future    Standing Expiration Date:   10/10/2016  . Kappa/lambda light chains    Standing Status:   Future    Standing Expiration Date:   10/10/2016  . Multiple Myeloma Panel (SPEP&IFE w/QIG)    Standing Status:   Future    Standing Expiration Date:   10/10/2016  . Beta 2 microglobulin, serum    Standing Status:   Future    Standing Expiration Date:   10/10/2016   All questions were answered. The patient knows to call the clinic with any problems, questions or concerns. No barriers to learning was detected. I spent 10 minutes counseling the patient face to face. The total time spent in the appointment was 15 minutes and more than 50% was on  counseling and review of test results     [facility], [place], MD 09/06/2015 5:13 PM

## 2015-09-06 NOTE — Assessment & Plan Note (Signed)
So far, her test results are consistent with MGUS. I recommend history, physical examination and blood work in one year. I educated the patient signs and symptoms to watch out for multiple myeloma. We discussed the natural history of multiple myeloma in the rationale of surveillance right now since she has no evidence of organ damage. I recommend vitamin D supplement  

## 2015-10-22 DIAGNOSIS — M25552 Pain in left hip: Secondary | ICD-10-CM | POA: Insufficient documentation

## 2015-10-22 DIAGNOSIS — Y9389 Activity, other specified: Secondary | ICD-10-CM | POA: Insufficient documentation

## 2015-10-22 DIAGNOSIS — Y999 Unspecified external cause status: Secondary | ICD-10-CM | POA: Diagnosis not present

## 2015-10-22 DIAGNOSIS — Y92410 Unspecified street and highway as the place of occurrence of the external cause: Secondary | ICD-10-CM | POA: Diagnosis not present

## 2015-10-23 ENCOUNTER — Emergency Department (HOSPITAL_COMMUNITY)
Admission: EM | Admit: 2015-10-23 | Discharge: 2015-10-23 | Disposition: A | Discharge status: Home / Self Care | Payer: BLUE CROSS/BLUE SHIELD | Attending: Emergency Medicine | Admitting: Emergency Medicine

## 2015-10-23 ENCOUNTER — Encounter (HOSPITAL_COMMUNITY): Payer: Self-pay | Admitting: Emergency Medicine

## 2015-10-23 DIAGNOSIS — M25512 Pain in left shoulder: Secondary | ICD-10-CM

## 2015-10-23 DIAGNOSIS — M25552 Pain in left hip: Secondary | ICD-10-CM

## 2015-10-23 MED ORDER — CYCLOBENZAPRINE HCL 10 MG PO TABS: 10.0000 mg | ORAL_TABLET | Freq: Two times a day (BID) | ORAL | 0 refills | Status: AC | PRN

## 2015-10-23 MED ORDER — ACETAMINOPHEN 325 MG PO TABS
650.0000 mg | ORAL_TABLET | Freq: Once | ORAL | Status: AC
  Administered 2015-10-23: 650 mg via ORAL
  Filled 2015-10-23: qty 2

## 2015-10-23 MED ORDER — TRAMADOL HCL 50 MG PO TABS: 50.0000 mg | ORAL_TABLET | Freq: Four times a day (QID) | ORAL | 0 refills | Status: AC | PRN

## 2015-10-23 NOTE — Discharge Instructions (Signed)
Take the Flexeril on regular basis for next 2-3 days Tylenol every 6 hours and Ultram for more sever pain
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
## 2016-08-28 ENCOUNTER — Other Ambulatory Visit: Payer: BLUE CROSS/BLUE SHIELD

## 2016-09-04 ENCOUNTER — Ambulatory Visit: Payer: BLUE CROSS/BLUE SHIELD | Admitting: Hematology and Oncology

## 2016-09-20 IMAGING — CR DG BONE SURVEY MET
8 of 10 series · 8 of 10 positions shown · non-contrast
Comparison: None.

CLINICAL DATA: Monoclonal of myopathy of unknown significance.

EXAM:
METASTATIC BONE SURVEY

[w chest pa]
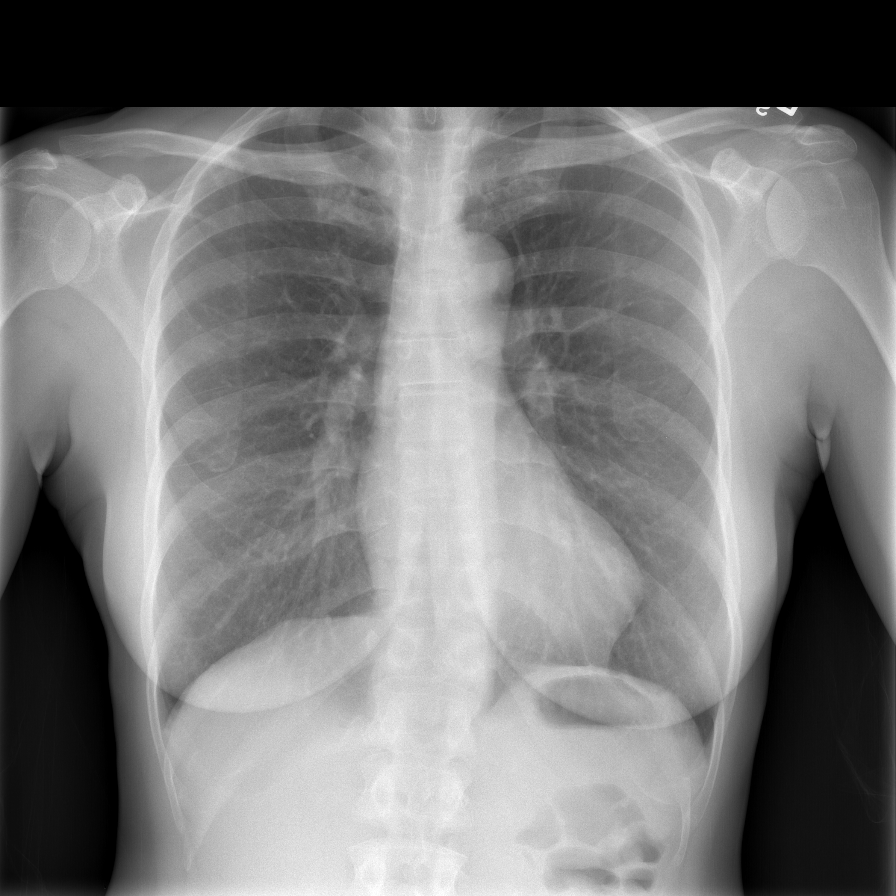

[w c-spine lat]
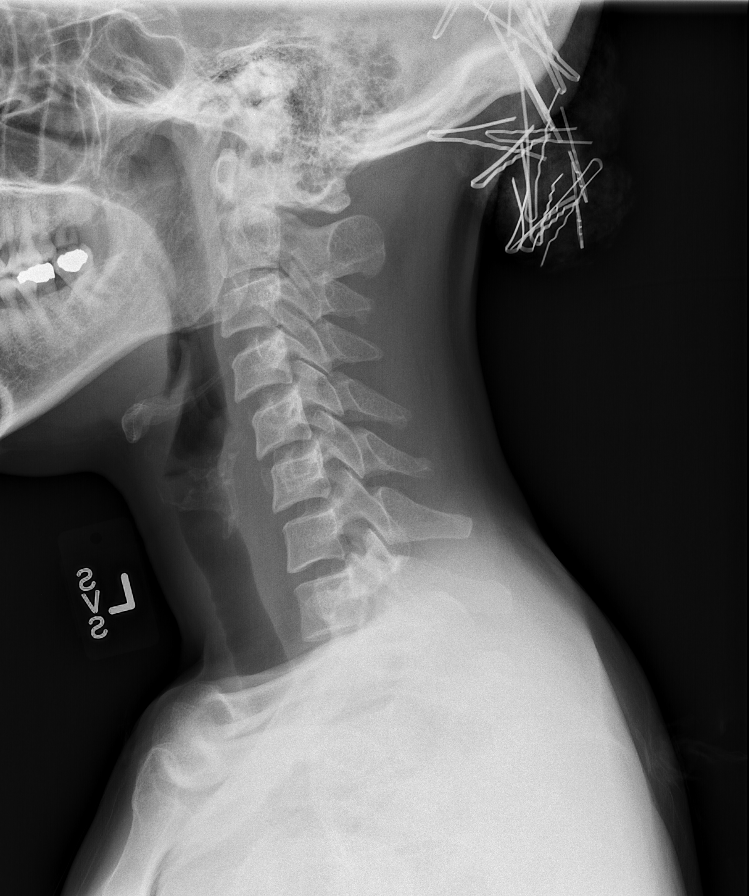

[w skull lat *]
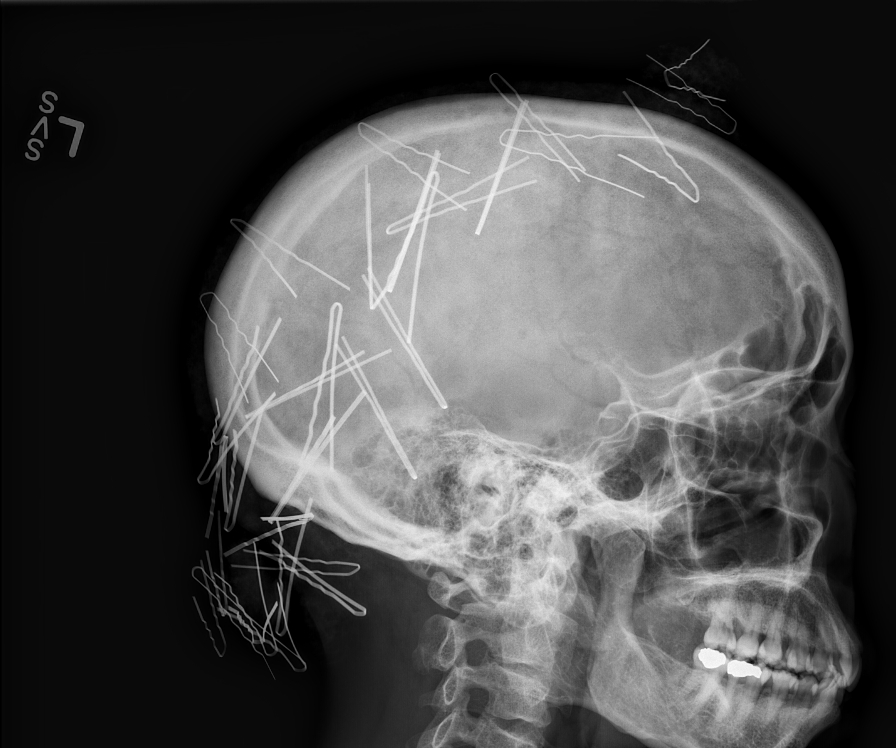

[x forearm ap left]
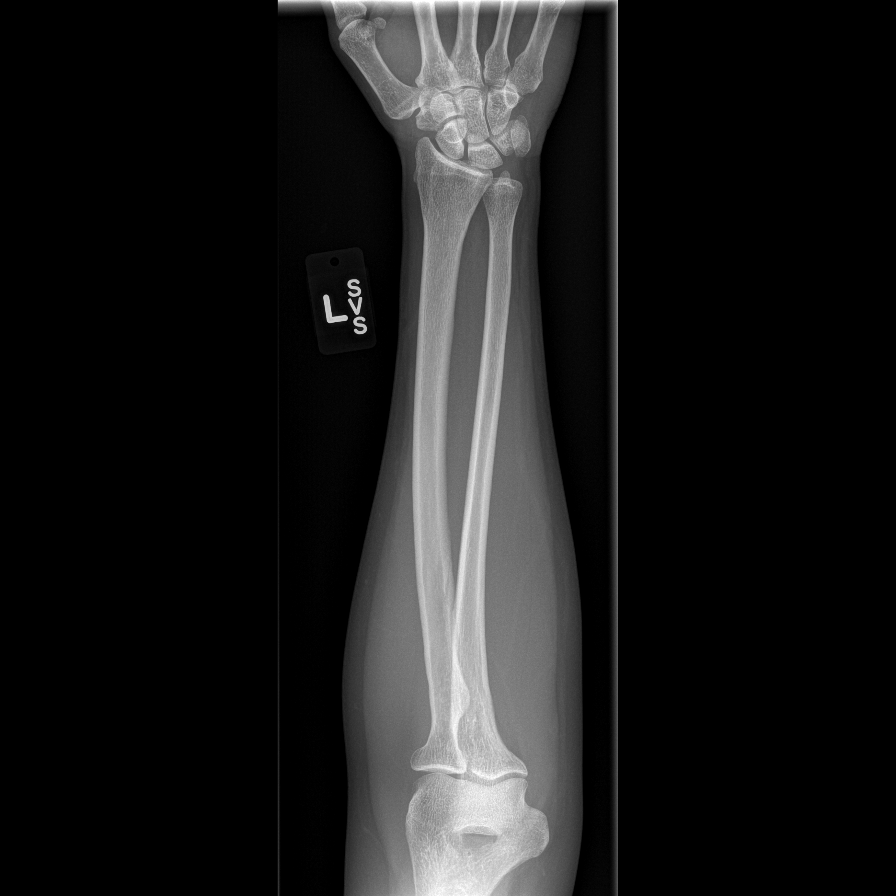

[x forearm ap right]
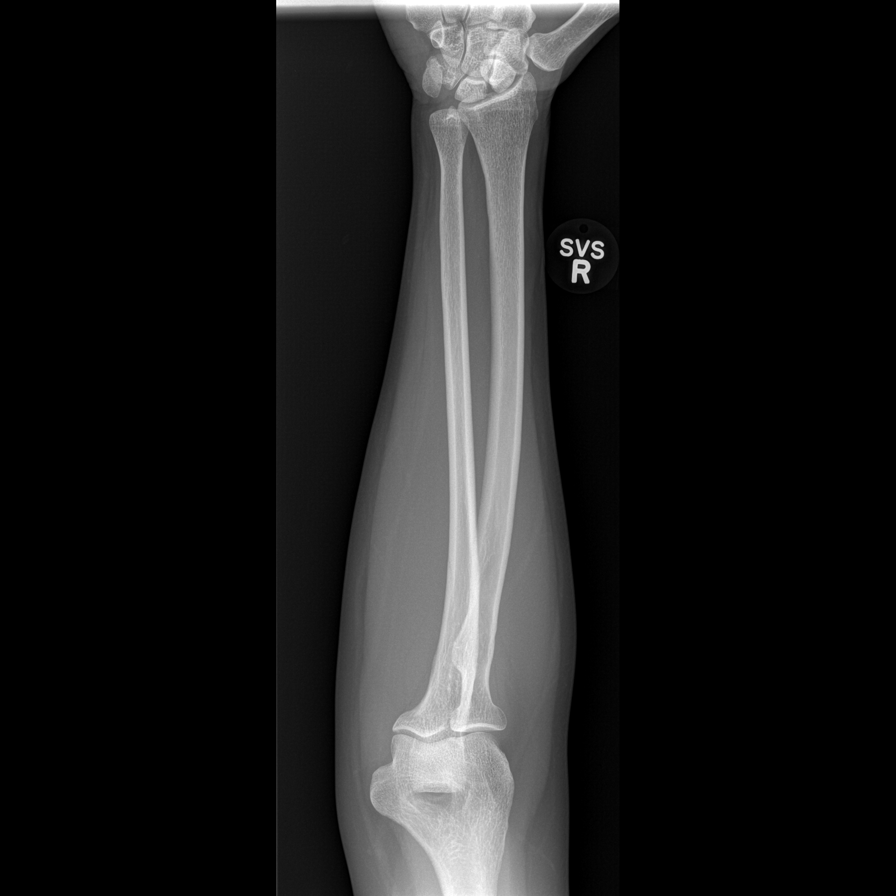

[t c-spine a.p.]
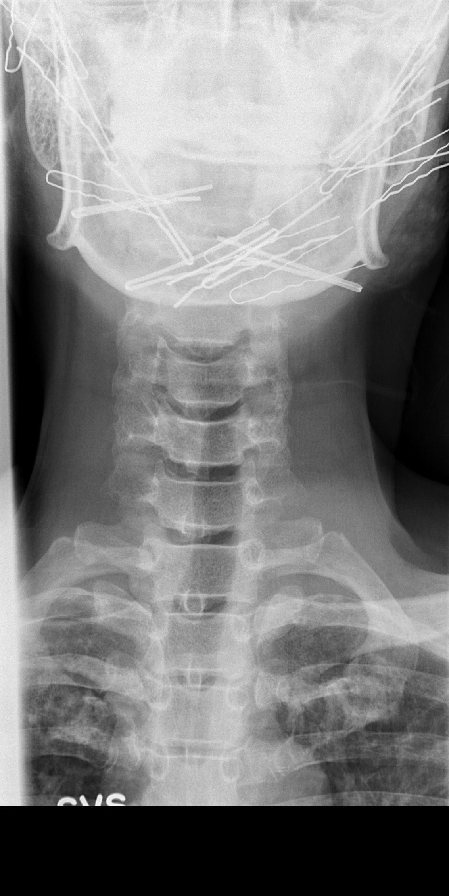

[t t-spine a.p.]
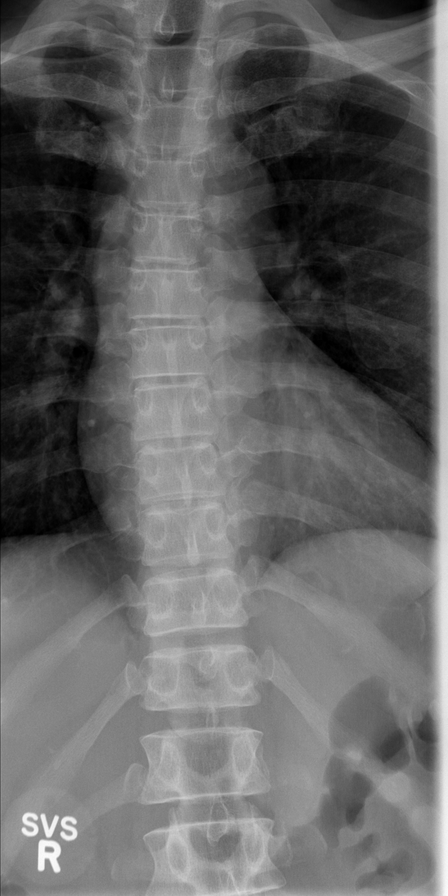

[t l-spine a.p.]
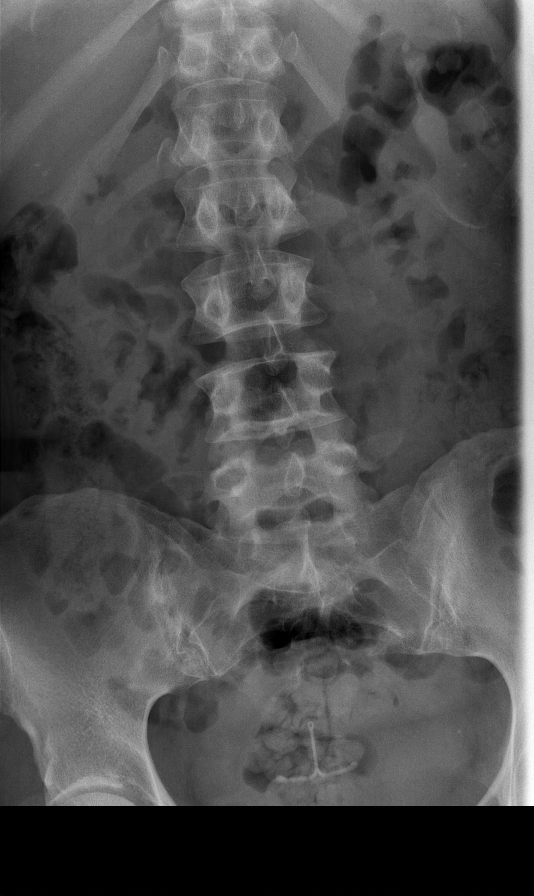

[8 of 10 positions shown; findings below may reference images not displayed]

FINDINGS: Evaluation of the skull is limited due to overlying Yomaira Y Pinedo. No
lytic destruction is seen within the visualized skeleton. This
includes the chest, all 4 extremities, cervical, thoracic and lumbar
spine pelvis and skull.
IMPRESSION: No definite evidence of lytic lesion or destruction seen in the
visualized skeleton.

## 2017-08-05 ENCOUNTER — Encounter (HOSPITAL_COMMUNITY): Payer: Self-pay | Admitting: Obstetrics and Gynecology
